# Patient Record
Sex: Female | Born: 1985 | Race: Black or African American | Hispanic: No | Marital: Single | Smoking: Never smoker
Health system: Southern US, Community
[De-identification: ages and names within clinical notes are randomized; demographics above are authoritative.]

## PROBLEM LIST (undated history)

## (undated) ENCOUNTER — Ambulatory Visit: Admission: EM | Payer: Medicaid Other

## (undated) DIAGNOSIS — G43909 Migraine, unspecified, not intractable, without status migrainosus: Secondary | ICD-10-CM

## (undated) DIAGNOSIS — R011 Cardiac murmur, unspecified: Secondary | ICD-10-CM

## (undated) DIAGNOSIS — D571 Sickle-cell disease without crisis: Secondary | ICD-10-CM

## (undated) DIAGNOSIS — D369 Benign neoplasm, unspecified site: Secondary | ICD-10-CM

## (undated) DIAGNOSIS — Z5189 Encounter for other specified aftercare: Secondary | ICD-10-CM

## (undated) DIAGNOSIS — J189 Pneumonia, unspecified organism: Secondary | ICD-10-CM

## (undated) DIAGNOSIS — L309 Dermatitis, unspecified: Secondary | ICD-10-CM

## (undated) DIAGNOSIS — I639 Cerebral infarction, unspecified: Secondary | ICD-10-CM

## (undated) DIAGNOSIS — J45909 Unspecified asthma, uncomplicated: Secondary | ICD-10-CM

## (undated) DIAGNOSIS — K602 Anal fissure, unspecified: Secondary | ICD-10-CM

## (undated) HISTORY — PX: CHOLECYSTECTOMY: SHX55

## (undated) HISTORY — DX: Dermatitis, unspecified: L30.9

## (undated) HISTORY — DX: Anal fissure, unspecified: K60.2

## (undated) HISTORY — PX: TONSILLECTOMY: SUR1361

## (undated) HISTORY — PX: DERMOID CYST  EXCISION: SHX1452

## (undated) HISTORY — DX: Migraine, unspecified, not intractable, without status migrainosus: G43.909

## (undated) HISTORY — DX: Pneumonia, unspecified organism: J18.9

## (undated) HISTORY — DX: Benign neoplasm, unspecified site: D36.9

## (undated) HISTORY — DX: Cardiac murmur, unspecified: R01.1

---
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
## 2013-05-27 DIAGNOSIS — H5210 Myopia, unspecified eye: Secondary | ICD-10-CM | POA: Insufficient documentation

## 2013-05-27 DIAGNOSIS — H5340 Unspecified visual field defects: Secondary | ICD-10-CM | POA: Insufficient documentation

## 2013-12-08 HISTORY — PX: OTHER SURGICAL HISTORY: SHX169

## 2015-03-27 DIAGNOSIS — D279 Benign neoplasm of unspecified ovary: Secondary | ICD-10-CM | POA: Insufficient documentation

## 2015-10-16 DIAGNOSIS — D649 Anemia, unspecified: Secondary | ICD-10-CM | POA: Insufficient documentation

## 2016-03-07 DIAGNOSIS — L299 Pruritus, unspecified: Secondary | ICD-10-CM | POA: Insufficient documentation

## 2016-03-24 DIAGNOSIS — I63131 Cerebral infarction due to embolism of right carotid artery: Secondary | ICD-10-CM | POA: Insufficient documentation

## 2016-03-24 DIAGNOSIS — H04129 Dry eye syndrome of unspecified lacrimal gland: Secondary | ICD-10-CM | POA: Insufficient documentation

## 2018-09-24 DIAGNOSIS — J189 Pneumonia, unspecified organism: Secondary | ICD-10-CM | POA: Insufficient documentation

## 2019-04-28 ENCOUNTER — Emergency Department (HOSPITAL_COMMUNITY): Payer: Medicaid Other

## 2019-04-28 ENCOUNTER — Other Ambulatory Visit: Payer: Self-pay

## 2019-04-28 ENCOUNTER — Emergency Department (HOSPITAL_COMMUNITY)
Admission: EM | Admit: 2019-04-28 | Discharge: 2019-04-28 | Disposition: A | Discharge status: Home / Self Care | Payer: Medicaid Other | Attending: Emergency Medicine | Admitting: Emergency Medicine

## 2019-04-28 ENCOUNTER — Encounter (HOSPITAL_COMMUNITY): Payer: Self-pay

## 2019-04-28 DIAGNOSIS — D649 Anemia, unspecified: Secondary | ICD-10-CM

## 2019-04-28 DIAGNOSIS — Z8673 Personal history of transient ischemic attack (TIA), and cerebral infarction without residual deficits: Secondary | ICD-10-CM | POA: Diagnosis not present

## 2019-04-28 DIAGNOSIS — R531 Weakness: Secondary | ICD-10-CM | POA: Insufficient documentation

## 2019-04-28 DIAGNOSIS — R195 Other fecal abnormalities: Secondary | ICD-10-CM | POA: Diagnosis not present

## 2019-04-28 DIAGNOSIS — J45909 Unspecified asthma, uncomplicated: Secondary | ICD-10-CM | POA: Diagnosis not present

## 2019-04-28 HISTORY — DX: Cerebral infarction, unspecified: I63.9

## 2019-04-28 HISTORY — DX: Unspecified asthma, uncomplicated: J45.909

## 2019-04-28 HISTORY — DX: Encounter for other specified aftercare: Z51.89

## 2019-04-28 HISTORY — DX: Sickle-cell disease without crisis: D57.1

## 2019-04-28 LAB — CBC WITH DIFFERENTIAL/PLATELET
Abs Immature Granulocytes: 0 10*3/uL (ref 0.00–0.07)
Band Neutrophils: 1 %
Basophils Absolute: 0.2 10*3/uL — ABNORMAL HIGH (ref 0.0–0.1)
Basophils Relative: 1 %
Eosinophils Absolute: 2.4 10*3/uL — ABNORMAL HIGH (ref 0.0–0.5)
Eosinophils Relative: 14 %
HCT: 18.9 % — ABNORMAL LOW (ref 36.0–46.0)
Hemoglobin: 6.5 g/dL — CL (ref 12.0–15.0)
Lymphocytes Relative: 12 %
Lymphs Abs: 2 10*3/uL (ref 0.7–4.0)
MCH: 33.3 pg (ref 26.0–34.0)
MCHC: 34.4 g/dL (ref 30.0–36.0)
MCV: 96.9 fL (ref 80.0–100.0)
Monocytes Absolute: 0.7 10*3/uL (ref 0.1–1.0)
Monocytes Relative: 4 %
Neutro Abs: 11.6 10*3/uL — ABNORMAL HIGH (ref 1.7–7.7)
Neutrophils Relative %: 68 %
Platelets: 445 10*3/uL — ABNORMAL HIGH (ref 150–400)
RBC: 1.95 MIL/uL — ABNORMAL LOW (ref 3.87–5.11)
RDW: 23.9 % — ABNORMAL HIGH (ref 11.5–15.5)
WBC: 16.8 10*3/uL — ABNORMAL HIGH (ref 4.0–10.5)
nRBC: 2.8 % — ABNORMAL HIGH (ref 0.0–0.2)
nRBC: 3 /100 WBC — ABNORMAL HIGH

## 2019-04-28 LAB — URINALYSIS, ROUTINE W REFLEX MICROSCOPIC
Bilirubin Urine: NEGATIVE
Glucose, UA: NEGATIVE mg/dL
Hgb urine dipstick: NEGATIVE
Ketones, ur: NEGATIVE mg/dL
Leukocytes,Ua: NEGATIVE
Nitrite: NEGATIVE
Protein, ur: NEGATIVE mg/dL
Specific Gravity, Urine: 1.012 (ref 1.005–1.030)
pH: 6 (ref 5.0–8.0)

## 2019-04-28 LAB — COMPREHENSIVE METABOLIC PANEL
ALT: 16 U/L (ref 0–44)
AST: 31 U/L (ref 15–41)
Albumin: 3.5 g/dL (ref 3.5–5.0)
Alkaline Phosphatase: 47 U/L (ref 38–126)
Anion gap: 10 (ref 5–15)
BUN: 6 mg/dL (ref 6–20)
CO2: 17 mmol/L — ABNORMAL LOW (ref 22–32)
Calcium: 7.9 mg/dL — ABNORMAL LOW (ref 8.9–10.3)
Chloride: 115 mmol/L — ABNORMAL HIGH (ref 98–111)
Creatinine, Ser: 0.6 mg/dL (ref 0.44–1.00)
GFR calc Af Amer: >60 mL/min (ref >60)
GFR calc non Af Amer: >60 mL/min (ref >60)
Glucose, Bld: 83 mg/dL (ref 70–99)
Potassium: 3.5 mmol/L (ref 3.5–5.1)
Sodium: 142 mmol/L (ref 135–145)
Total Bilirubin: 2.7 mg/dL — ABNORMAL HIGH (ref 0.3–1.2)
Total Protein: 5.7 g/dL — ABNORMAL LOW (ref 6.5–8.1)

## 2019-04-28 LAB — TROPONIN I: Troponin I: <0.03 ng/mL (ref <0.03)

## 2019-04-28 LAB — POC OCCULT BLOOD, ED: Fecal Occult Bld: POSITIVE — AB

## 2019-04-28 LAB — CBG MONITORING, ED: Glucose-Capillary: 97 mg/dL (ref 70–99)

## 2019-04-28 IMAGING — DX PORTABLE CHEST - 1 VIEW
1 series · 1 of 1 positions shown · non-contrast
Comparison: None.

CLINICAL DATA: Shortness of breath

EXAM:
PORTABLE CHEST 1 VIEW

[chest ap]
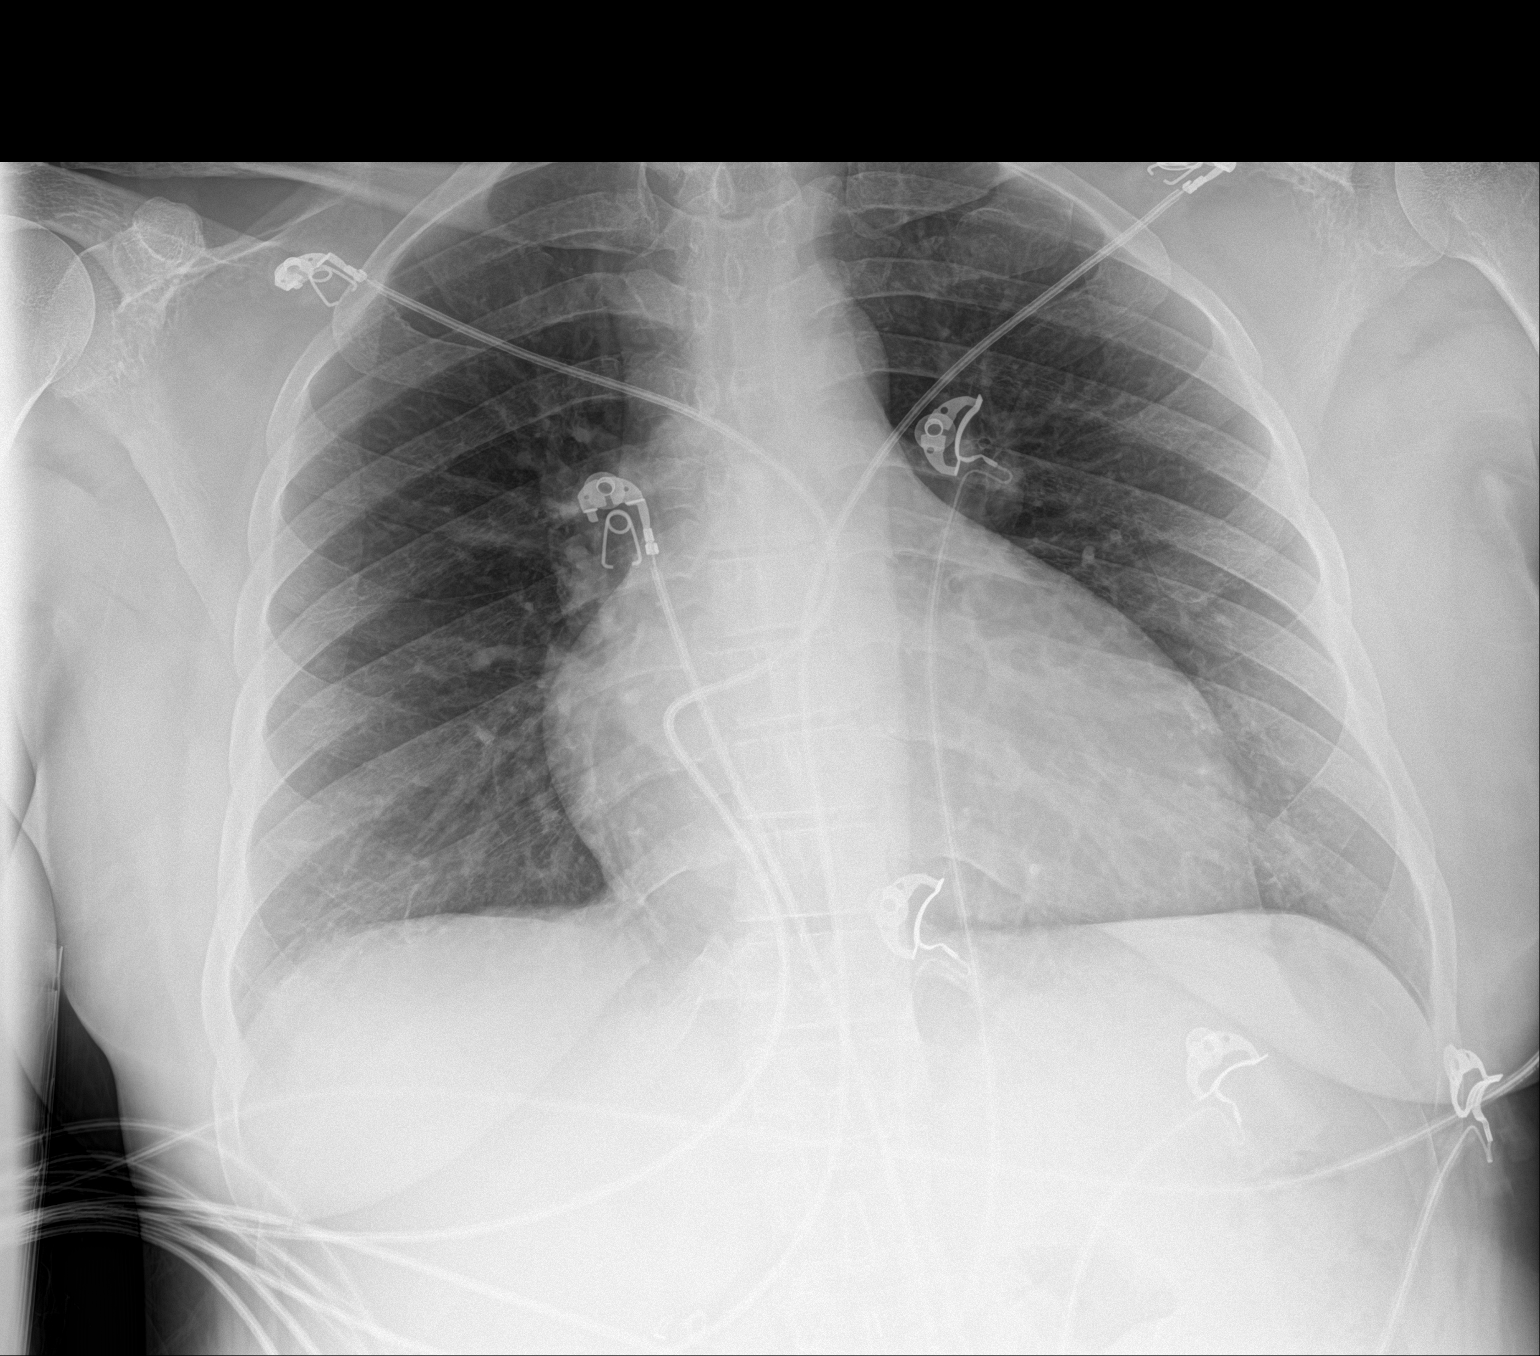

[1 of 1 positions shown; findings below may reference images not displayed]

FINDINGS: The lungs are clear. Heart is slightly enlarged with pulmonary
vascularity normal. No adenopathy. No bone lesions.
IMPRESSION: Heart slightly enlarged.  No edema or consolidation.

## 2019-04-28 MED ORDER — ACETAMINOPHEN 325 MG PO TABS
650.0000 mg | ORAL_TABLET | Freq: Once | ORAL | Status: AC
  Administered 2019-04-28: 650 mg via ORAL
  Filled 2019-04-28: qty 2

## 2019-04-28 MED ORDER — METOCLOPRAMIDE HCL 5 MG/ML IJ SOLN
10.0000 mg | Freq: Once | INTRAMUSCULAR | Status: AC
  Administered 2019-04-28: 18:00:00 10 mg via INTRAVENOUS
  Filled 2019-04-28: qty 2

## 2019-04-28 NOTE — ED Triage Notes (Signed)
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 Pt had a near syncopal episode today. Reports soft stool. Placed on 2lpm for 92% Sp02.

## 2019-04-28 NOTE — ED Notes (Signed)
Pt has been in contact with her sister and mom during the afternoon.

## 2019-04-28 NOTE — ED Notes (Signed)
Notified edp of hgb of 6.5

## 2019-04-28 NOTE — Discharge Instructions (Addendum)
You have been seen today for generalized weakness. Please read and follow all provided instructions.  ° °1. Medications: usual home medications °2. Treatment: rest, drink plenty of fluids °3. Follow Up: Please follow up with your primary doctor in 2 days for discussion of your diagnoses and further evaluation after today's visit; if you do not have a primary care doctor use the resource guide provided to find one; Please return to the ER for any new or worsening symptoms. Please obtain all of your results from medical records or have your doctors office obtain the results - share them with your doctor - you should be seen at your doctors office. Call today to arrange your follow up.  ° °Take medications as prescribed. Please review all of the medicines and only take them if you do not have an allergy to them. Return to the emergency room for worsening condition or new concerning symptoms. Follow up with your regular doctor. If you don't have a regular doctor use one of the numbers below to establish a primary care doctor. ° °Please be aware that if you are taking birth control pills, taking other prescriptions, ESPECIALLY ANTIBIOTICS may make the birth control ineffective - if this is the case, either do not engage in sexual activity or use alternative methods of birth control such as condoms until you have finished the medicine and your family doctor says it is OK to restart them. If you are on a blood thinner such as COUMADIN, be aware that any other medicine that you take may cause the coumadin to either work too much, or not enough - you should have your coumadin level rechecked in next 7 days if this is the case.  °?  °It is also a possibility that you have an allergic reaction to any of the medicines that you have been prescribed - Everybody reacts differently to medications and while MOST people have no trouble with most medicines, you may have a reaction such as nausea, vomiting, rash, swelling, shortness of  breath. If this is the case, please stop taking the medicine immediately and contact your physician.  °?  °You should return to the ER if you develop severe or worsening symptoms.  ° °Emergency Department Resource Guide °1) Find a Doctor and Pay Out of Pocket °Although you won't have to find out who is covered by your insurance plan, it is a good idea to ask around and get recommendations. You will then need to call the office and see if the doctor you have chosen will accept you as a new patient and what types of options they offer for patients who are self-pay. Some doctors offer discounts or will set up payment plans for their patients who do not have insurance, but you will need to ask so you aren't surprised when you get to your appointment. ° °2) Contact Your Local Health Department °Not all health departments have doctors that can see patients for sick visits, but many do, so it is worth a call to see if yours does. If you don't know where your local health department is, you can check in your phone book. The CDC also has a tool to help you locate your state's health department, and many state websites also have listings of all of their local health departments. ° °3) Find a Walk-in Clinic °If your illness is not likely to be very severe or complicated, you may want to try a walk in clinic. These are popping up all over   the country in pharmacies, drugstores, and shopping centers. They're usually staffed by nurse practitioners or physician assistants that have been trained to treat common illnesses and complaints. They're usually fairly quick and inexpensive. However, if you have serious medical issues or chronic medical problems, these are probably not your best option. ° °No Primary Care Doctor: °Call [facility] at  832-8000 - they can help you locate a primary care doctor that  accepts your insurance, provides certain services, etc. °Physician Referral Service- 1-800-533-3463 ° °Chronic Pain  Problems: °Organization         Address  Phone   Notes  °[facility]  ([phone]) [phone] Patients need to be referred by their primary care doctor.  ° °Medication Assistance: °Organization         Address  Phone   Notes  °[place] [facility] [address] °[place], [place] [address] [phone]) [phone] --Must be a resident of [place] °-- Must have NO insurance coverage whatsoever (no Medicaid/ Medicare, etc.) °-- The pt. MUST have a primary care doctor that directs their care regularly and follows them in the community °  °[facility]  (866) 331-1348   °[employer]  (888) 892-1162   ° °Agencies that provide inexpensive medical care: °Organization         Address  Phone   Notes  °[facility]  ([phone]) [phone]   °[facility]    ([phone]) [phone]   °[facility] [address] °[place] [address] ([phone]) [phone]   °[facility] [address], °[place] ([phone]) [phone]   °[facility]    (336) [phone]   °[facility]    (336) 272-1050   °[facility] ° [address], [place] Phone:  (336) [phone], Fax:  ([phone]) [phone] Hours of Operation:  9 am - 6 pm, M-F.  Also accepts Medicaid/Medicare and self-pay.  °[facility] ° [address], [place] Phone: ([phone]) [phone], Fax: ([phone]) [phone]. Hours of Operation:  8:30 am - 5:30 pm, M-F.  Also accepts Medicaid and self-pay.  °[facility] [place] [address], [place] Phone: ([phone]) [phone]   °[facility] [address], [place], [place] (336)723-1848, Ext. 123 Mondays & Thursdays: 7-9 AM.  First 15 patients are seen on a first come, first serve basis. °  ° °Medicaid-accepting [place] Providers: ° °Organization         Address  Phone   Notes  °[facility] [address], [place] ([phone]) [phone] Also  accepts self-pay patients.  °[facility] [address], [place] ° ([phone]) [phone]   °[facility] [address], [place] ([phone]) [phone]   °[facility] [address] [place] [address], [place] ([phone]) [phone]   °[facility] [address], [place]  ° ([phone]) [phone] Only accepts [place] Access Medicaid patients after they have their name applied to their card.  ° °Self-Pay (no insurance) in [place]: ° °Organization         Address  Phone   Notes  °Sickle Cell Patients, [facility] [address], [place] ([phone]) [phone]   °[facility] [address],  ([phone]) [phone]   °[facility] ° [address], [place] ([phone]) [phone]   °[facility]/Dr. Osei-Bonsu ° [address] [place] [address], [place] or [address], [place] [phone]) [phone] Phone number for both [place] and [place] locations is the same.  °[facility] [address], [place] ([phone]) [phone]   °[facility] [address] [place] [address],  or [address] ([phone]) [phone] °(336) [phone]   °[facility] [address],  ([phone]) [phone], phone; ([phone]) [phone], fax Sees patients 1st and 3rd Saturday of every month.  Must not qualify for public or private insurance (i.e. Medicaid, Medicare, [place] [employer] Choice, Veterans' Benefits)  Household income should be no more than 200% of the poverty level The clinic cannot treat you if you are pregnant or think you are pregnant  Sexually transmitted diseases are not treated at the clinic.  °  ° °

## 2019-04-28 NOTE — ED Notes (Signed)
Patient verbalizes understanding of discharge instructions. Opportunity for questioning and answers were provided. Armband removed by staff, pt discharged from ED.  

## 2019-04-28 NOTE — ED Notes (Signed)
Pt made aware of the need for urine.

## 2019-04-28 NOTE — ED Provider Notes (Signed)
[facility] EMERGENCY DEPARTMENT Provider Note   CSN: [id number] Arrival date & time: 04/28/19  1513    History   Chief Complaint Chief Complaint  Patient presents with  . Weakness    HPI Shannon Roy is a 33 y.o. female with a PMH of Sickle Cell Anemia, Asthma, Blood Transfusions, Migraines, and CVA in 1993 presents with constant generalized weakness onset 30 minutes ago. Patient reports she was packing up water bottles when symptoms began. Patient reports she felt lightheaded and saw spots in her vision. Patient also reports a constant throbbing frontal headache. Patient states headache is similar to her migraines. Patient denies numbness or vision field deficits. Patient reports shortness of breath with presyncopal episode, but denies shortness of breath currently. Patient reports dizziness for a few days and states she takes meclizine. Patient reports standing makes symptoms worse and resting makes symptoms better. Patient reports intermittent small amount of blood in her stool due to hemorrhoids. Patient reports this is chronic. Patient reports last blood transfusion was 1 year ago. Patient states her baseline hemoglobin is between 6-7 and states she has blood transfusions when her hemoglobin is 6.2 or lower. Patient denies nausea, vomiting, or abdominal pain. Patient denies fever, cough, congestion, rhinorrhea, sick contacts, or recent travel. LMP on 04/01/2019.      HPI  Past Medical History:  Diagnosis Date  . Asthma   . Blood transfusion without reported diagnosis   . Sickle cell anemia (HCC)   . Stroke [facility])     There are no active problems to display for this patient.   Past Surgical History:  Procedure Laterality Date  . CHOLECYSTECTOMY    . TONSILLECTOMY       OB History   No obstetric history on file.      Home Medications    Prior to Admission medications   Not on File    Family History History reviewed. No pertinent family history.   Social History Social History   Tobacco Use  . Smoking status: Never Smoker  . Smokeless tobacco: Never Used  Substance Use Topics  . Alcohol use: Not Currently  . Drug use: Not Currently     Allergies   Bee venom and Pollen extract   Review of Systems Review of Systems  Constitutional: Negative for activity change, appetite change, chills, diaphoresis, fatigue, fever and unexpected weight change.  HENT: Negative for congestion, rhinorrhea and sore throat.   Eyes: Negative for visual disturbance.  Respiratory: Positive for shortness of breath. Negative for cough.   Cardiovascular: Negative for chest pain, palpitations and leg swelling.  Gastrointestinal: Positive for blood in stool. Negative for abdominal pain, diarrhea, nausea and vomiting.  Genitourinary: Negative for dysuria, flank pain and frequency.  Musculoskeletal: Negative for myalgias.  Skin: Negative for pallor.  Neurological: Positive for dizziness, weakness, light-headedness and headaches. Negative for seizures, syncope, speech difficulty and numbness.  Hematological: Does not bruise/bleed easily.  Psychiatric/Behavioral: Negative for sleep disturbance. The patient is not nervous/anxious.     Physical Exam Updated Vital Signs BP 116/60   Pulse 85   Temp 98.8 F (37.1 C) (Oral)   Resp 20   Ht 5\' 7"  (1.702 m)   Wt 83.9 kg   LMP 04/01/2019 (Exact Date)   SpO2 99%   BMI 28.98 kg/m   Physical Exam Vitals signs and nursing note reviewed. Exam conducted with a chaperone present.  Constitutional:      General: She is not in acute distress.  Appearance: She is well-developed. She is not diaphoretic.  HENT:     Head: Normocephalic and atraumatic.     Mouth/Throat:     Mouth: Mucous membranes are moist.     Pharynx: No posterior oropharyngeal erythema.  Eyes:     Extraocular Movements: Extraocular movements intact.     Conjunctiva/sclera: Conjunctivae normal.     Pupils: Pupils are equal, round, and  reactive to light.  Neck:     Musculoskeletal: Normal range of motion.  Cardiovascular:     Rate and Rhythm: Normal rate and regular rhythm.     Heart sounds: Normal heart sounds. No murmur. No friction rub. No gallop.   Pulmonary:     Effort: Pulmonary effort is normal. No respiratory distress.     Breath sounds: Normal breath sounds. No wheezing or rales.  Abdominal:     Palpations: Abdomen is soft.     Tenderness: There is no abdominal tenderness.  Genitourinary:    Rectum: Guaiac result positive. External hemorrhoid (No bleeding noted.) present. No tenderness or anal fissure. Normal anal tone.  Musculoskeletal: Normal range of motion.  Skin:    General: Skin is warm.     Findings: No erythema or rash.  Neurological:     Mental Status: She is alert.    Mental Status:  Alert, oriented, thought content appropriate, able to give a coherent history. Speech fluent without evidence of aphasia. Able to follow 2 step commands without difficulty.  Cranial Nerves:  II:  Peripheral visual fields grossly normal, pupils equal, round, reactive to light III,IV, VI: ptosis not present, extra-ocular motions intact bilaterally  V,VII: smile symmetric, facial light touch sensation equal VIII: hearing grossly normal to voice  IX,X: symmetric elevation of soft palate, uvula elevates symmetrically  XI: bilateral shoulder shrug symmetric and strong XII: midline tongue extension without fassiculations Motor:  Normal tone. 5/5 in upper and lower extremities bilaterally including strong and equal grip strength and dorsiflexion/plantar flexion Sensory: light touch normal in all extremities.  Deep Tendon Reflexes: 2+ and symmetric in the biceps and patella Cerebellar: normal finger-to-nose with bilateral upper extremities Gait: normal gait and balance.  Negative pronator drift. Negative Romberg sign. CV: distal pulses palpable throughout    ED Treatments / Results  Labs (all labs ordered are  listed, but only abnormal results are displayed) Labs Reviewed  COMPREHENSIVE METABOLIC PANEL - Abnormal; Notable for the following components:      Result Value   Chloride 115 (*)    CO2 17 (*)    Calcium 7.9 (*)    Total Protein 5.7 (*)    Total Bilirubin 2.7 (*)    All other components within normal limits  CBC WITH DIFFERENTIAL/PLATELET - Abnormal; Notable for the following components:   WBC 16.8 (*)    RBC 1.95 (*)    Hemoglobin 6.5 (*)    HCT 18.9 (*)    RDW 23.9 (*)    Platelets 445 (*)    nRBC 2.8 (*)    Neutro Abs 11.6 (*)    Eosinophils Absolute 2.4 (*)    Basophils Absolute 0.2 (*)    nRBC 3 (*)    All other components within normal limits  POC OCCULT BLOOD, ED - Abnormal; Notable for the following components:   Fecal Occult Bld POSITIVE (*)    All other components within normal limits  TROPONIN I  URINALYSIS, ROUTINE W REFLEX MICROSCOPIC  CBC WITH DIFFERENTIAL/PLATELET  CBG MONITORING, ED  I-STAT BETA HCG BLOOD, ED ([facility],  [facility], [facility] ONLY)    EKG EKG Interpretation  Date/Time:  Thursday Apr 28 2019 15:18:13 EDT Ventricular Rate:  85 PR Interval:    QRS Duration: 88 QT Interval:  382 QTC Calculation: 455 R Axis:   56 Text Interpretation:  Sinus rhythm Confirmed by Isla Pence [phone]) on 04/28/2019 3:59:57 PM   Radiology Dg Chest Port 1 View  Result Date: 04/28/2019 CLINICAL DATA:  Shortness of breath EXAM: PORTABLE CHEST 1 VIEW COMPARISON:  None. FINDINGS: The lungs are clear. Heart is slightly enlarged with pulmonary vascularity normal. No adenopathy. No bone lesions. IMPRESSION: Heart slightly enlarged.  No edema or consolidation. Electronically Signed   By: Lowella Grip III M.D.   On: 04/28/2019 16:50    Procedures Procedures (including critical care time)  Medications Ordered in ED Medications  acetaminophen (TYLENOL) tablet 650 mg (650 mg Oral Given 04/28/19 1615)  metoCLOPramide (REGLAN) injection 10 mg (10 mg Intravenous Given 04/28/19 1801)      Initial Impression / Assessment and Plan / ED Course  I have reviewed the triage vital signs and the nursing notes.  Pertinent labs & imaging results that were available during my care of the patient were reviewed by me and considered in my medical decision making (see chart for details).  Clinical Course as of Apr 27 1918  Thu Apr 28, 2019  1708 Heart slightly enlarged.  No edema or consolidation noted on CXR.  DG Chest Port 1 View [AH]  1840 Fecal occult blood test is positive. Suspect this is likely due to hemorrhoids.   Fecal Occult Blood, POC(!): POSITIVE [AH]  1904 Hemoglobin low at 6.5. No previous values available for comparison.  Hemoglobin(!!): 6.5 [AH]  1911 Patient reports symptoms have significantly improved while in the ER. Discussed results with patient. Patient states she does not want a blood transfusion at this time. Patient reports she does not want to be admitted to the hospital.   [AH]    Clinical Course User Index [AH] Arville Lime, PA-C      Patient presents with generalized weakness. Neurological exam is benign. CXR does not reveal edema or consolidation. Hemoglobin is low at 6.5. Suspect symptoms are likely due to anemia. Provided reglan and tylenol for headache with complete relief. Symptoms significantly improved while in the ER. Patient is able to ambulate without difficulty. Discussed results in detail with the patient and patient does not wish to be admitted or receive blood transfusion at this time. We discussed the nature and purpose, risks and benefits, as well as, the alternatives of treatment. Time was given to allow the opportunity to ask questions and consider their options, and after the discussion, the patient decided to refuse the offerred treatment. Prior to refusing, I determined that the patient had the capacity to make their decision and understood the consequences of that decision. The patient was notified that they may return to the  emergency department at any time for further treatment. Advised patient to follow up with PCP in 2 days. Patient will be discharged. Patient states she understands and agrees with plan.    Findings and plan of care discussed with supervising physician Dr. Gilford Raid.  Final Clinical Impressions(s) / ED Diagnoses   Final diagnoses:  Generalized weakness  Low hemoglobin    ED Discharge Orders    None       Julienne Kass 04/28/19 Oris Drone, MD 05/03/19 [phone]

## 2019-09-07 ENCOUNTER — Emergency Department (HOSPITAL_COMMUNITY)
Admission: EM | Admit: 2019-09-07 | Discharge: 2019-09-07 | Disposition: A | Discharge status: Home / Self Care | Payer: Medicaid Other | Attending: Emergency Medicine | Admitting: Emergency Medicine

## 2019-09-07 ENCOUNTER — Encounter (HOSPITAL_COMMUNITY): Payer: Self-pay | Admitting: *Deleted

## 2019-09-07 ENCOUNTER — Emergency Department (HOSPITAL_COMMUNITY): Payer: Medicaid Other

## 2019-09-07 DIAGNOSIS — H538 Other visual disturbances: Secondary | ICD-10-CM | POA: Diagnosis not present

## 2019-09-07 DIAGNOSIS — H5712 Ocular pain, left eye: Secondary | ICD-10-CM | POA: Diagnosis present

## 2019-09-07 DIAGNOSIS — R51 Headache: Secondary | ICD-10-CM | POA: Diagnosis not present

## 2019-09-07 DIAGNOSIS — D571 Sickle-cell disease without crisis: Secondary | ICD-10-CM | POA: Insufficient documentation

## 2019-09-07 DIAGNOSIS — Z8673 Personal history of transient ischemic attack (TIA), and cerebral infarction without residual deficits: Secondary | ICD-10-CM | POA: Insufficient documentation

## 2019-09-07 DIAGNOSIS — R519 Headache, unspecified: Secondary | ICD-10-CM

## 2019-09-07 LAB — RETICULOCYTES
Immature Retic Fract: 34.3 % — ABNORMAL HIGH (ref 2.3–15.9)
RBC.: 1.92 MIL/uL — ABNORMAL LOW (ref 3.87–5.11)
Retic Count, Absolute: 316.5 10*3/uL — ABNORMAL HIGH (ref 19.0–186.0)
Retic Ct Pct: 16.5 % — ABNORMAL HIGH (ref 0.4–3.1)

## 2019-09-07 LAB — COMPREHENSIVE METABOLIC PANEL
ALT: 12 U/L (ref 0–44)
AST: 22 U/L (ref 15–41)
Albumin: 4.2 g/dL (ref 3.5–5.0)
Alkaline Phosphatase: 44 U/L (ref 38–126)
Anion gap: 11 (ref 5–15)
BUN: 5 mg/dL — ABNORMAL LOW (ref 6–20)
CO2: 19 mmol/L — ABNORMAL LOW (ref 22–32)
Calcium: 9.1 mg/dL (ref 8.9–10.3)
Chloride: 110 mmol/L (ref 98–111)
Creatinine, Ser: 0.62 mg/dL (ref 0.44–1.00)
GFR calc Af Amer: >60 mL/min (ref >60)
GFR calc non Af Amer: >60 mL/min (ref >60)
Glucose, Bld: 112 mg/dL — ABNORMAL HIGH (ref 70–99)
Potassium: 4 mmol/L (ref 3.5–5.1)
Sodium: 140 mmol/L (ref 135–145)
Total Bilirubin: 2.2 mg/dL — ABNORMAL HIGH (ref 0.3–1.2)
Total Protein: 6.8 g/dL (ref 6.5–8.1)

## 2019-09-07 LAB — CBC WITH DIFFERENTIAL/PLATELET
Abs Immature Granulocytes: 0.09 10*3/uL — ABNORMAL HIGH (ref 0.00–0.07)
Basophils Absolute: 0 10*3/uL (ref 0.0–0.1)
Basophils Relative: 0 %
Eosinophils Absolute: 2.1 10*3/uL — ABNORMAL HIGH (ref 0.0–0.5)
Eosinophils Relative: 20 %
HCT: 18 % — ABNORMAL LOW (ref 36.0–46.0)
Hemoglobin: 6 g/dL — CL (ref 12.0–15.0)
Immature Granulocytes: 1 %
Lymphocytes Relative: 26 %
Lymphs Abs: 3 10*3/uL (ref 0.7–4.0)
MCH: 31.3 pg (ref 26.0–34.0)
MCHC: 33.3 g/dL (ref 30.0–36.0)
MCV: 93.8 fL (ref 80.0–100.0)
Monocytes Absolute: 1.2 10*3/uL — ABNORMAL HIGH (ref 0.1–1.0)
Monocytes Relative: 10 %
Neutro Abs: 4.8 10*3/uL (ref 1.7–7.7)
Neutrophils Relative %: 43 %
Platelets: 429 10*3/uL — ABNORMAL HIGH (ref 150–400)
RBC: 1.92 MIL/uL — ABNORMAL LOW (ref 3.87–5.11)
RDW: 23.6 % — ABNORMAL HIGH (ref 11.5–15.5)
WBC: 11.1 10*3/uL — ABNORMAL HIGH (ref 4.0–10.5)
nRBC: 2.7 % — ABNORMAL HIGH (ref 0.0–0.2)

## 2019-09-07 MED ORDER — GADOBUTROL 1 MMOL/ML IV SOLN
8.0000 mL | Freq: Once | INTRAVENOUS | Status: AC | PRN
  Administered 2019-09-07: 8 mL via INTRAVENOUS

## 2019-09-07 MED ORDER — SODIUM CHLORIDE 0.9 % IV BOLUS
1000.0000 mL | Freq: Once | INTRAVENOUS | Status: AC
  Administered 2019-09-07: 07:00:00 1000 mL via INTRAVENOUS

## 2019-09-07 MED ORDER — PROCHLORPERAZINE EDISYLATE 10 MG/2ML IJ SOLN
10.0000 mg | Freq: Once | INTRAMUSCULAR | Status: AC
  Administered 2019-09-07: 07:00:00 10 mg via INTRAVENOUS
  Filled 2019-09-07: qty 2

## 2019-09-07 MED ORDER — DEXAMETHASONE SODIUM PHOSPHATE 10 MG/ML IJ SOLN
10.0000 mg | Freq: Once | INTRAMUSCULAR | Status: AC
  Administered 2019-09-07: 10 mg via INTRAVENOUS
  Filled 2019-09-07: qty 1

## 2019-09-07 MED ORDER — KETOROLAC TROMETHAMINE 15 MG/ML IJ SOLN
15.0000 mg | Freq: Once | INTRAMUSCULAR | Status: AC
  Administered 2019-09-07: 15 mg via INTRAVENOUS
  Filled 2019-09-07: qty 1

## 2019-09-07 NOTE — ED Notes (Signed)
Patient transported to MRI 

## 2019-09-07 NOTE — ED Triage Notes (Signed)
To ED for eval after being woke with left eye pain. States she has a HA on left side as well. States when she woke she was unable to see out of her left eye - completely dark. Pt got up and went to wash her eye with water. Then could see out of eye but blurry. Reports eye was burning. Burning is now better but vision remains blurred. No neuro deficits noted. Pt ambulatory without difficulty. Speech clear.

## 2019-09-07 NOTE — ED Provider Notes (Signed)
  Physical Exam  BP 110/61   Pulse 67   Temp 98.6 F (37 C) (Oral)   Resp 16   SpO2 100%   Physical Exam  ED Course/Procedures     Procedures  MDM  Received patient in signout.  Headache visual change sickle cell disease.  Patient's headache feels much better after treatment.  Visual changes improved.  Vision almost back to normal.  MRI done and does not show neural abnormality.  Potentially could be complicated migraine although she has not had neuro deficits with her migraines in the past.  Stroke felt less likely.  Discharge home.       Davonna Belling, MD 09/07/19 1010

## 2019-09-07 NOTE — ED Provider Notes (Signed)
[facility] EMERGENCY DEPARTMENT Provider Note  CSN: [id number] Arrival date & time: 09/07/19 [id number]  Chief Complaint(s) Blurred Vision, Eye Pain, and Sickle Cell Pain Crisis  HPI Shannon Roy is a 33 y.o. female with a history of migraine headaches, sickle cell disease and prior related stroke with no residual deficits who presents to the emergency department with left eye pain and visual disturbance noted this morning.  Patient reports waking up feeling burning pain behind her eye as well as left frontal headache.  She reports associated blurry vision.  She denies any trauma.  No recent fevers or infections.  No cough or congestion.  No speech difficulty.  No other focal deficits.  She denies frequent sickle cell crises.  HPI  Past Medical History Past Medical History:  Diagnosis Date  . Asthma   . Blood transfusion without reported diagnosis   . Sickle cell anemia (HCC)   . Stroke [facility])    There are no active problems to display for this patient.  Home Medication(s) Prior to Admission medications   Not on File                                                                                                                                    Past Surgical History Past Surgical History:  Procedure Laterality Date  . CHOLECYSTECTOMY    . TONSILLECTOMY     Family History No family history on file.  Social History Social History   Tobacco Use  . Smoking status: Never Smoker  . Smokeless tobacco: Never Used  Substance Use Topics  . Alcohol use: Not Currently  . Drug use: Not Currently   Allergies Bee venom and Pollen extract  Review of Systems Review of Systems All other systems are reviewed and are negative for acute change except as noted in the HPI  Physical Exam Vital Signs  I have reviewed the triage vital signs BP 131/73   Pulse 75   Temp 98.6 F (37 C) (Oral)   Resp 16   SpO2 100%   Physical Exam Vitals signs reviewed.   Constitutional:      General: She is not in acute distress.    Appearance: She is well-developed. She is not diaphoretic.  HENT:     Head: Normocephalic and atraumatic.     Right Ear: External ear normal.     Left Ear: External ear normal.     Nose: Nose normal.  Eyes:     General: No scleral icterus.    Conjunctiva/sclera: Conjunctivae normal.     Comments: R: 20/20 L: 20/25 With corrective glasses.  Dyschromatopsia with left eye with +Pulfrich effect on left.  Neck:     Musculoskeletal: Normal range of motion.     Trachea: Phonation normal.  Cardiovascular:     Rate and Rhythm: Normal rate and regular rhythm.  Pulmonary:     Effort: Pulmonary effort is normal. No  respiratory distress.     Breath sounds: No stridor.  Abdominal:     General: There is no distension.  Musculoskeletal: Normal range of motion.  Neurological:     Mental Status: She is alert and oriented to person, place, and time.     Comments: Mental Status:  Alert and oriented to person, place, and time.  Attention and concentration normal.  Speech clear.  Recent memory is intact  Cranial Nerves:  II Visual Fields: Intact to confrontation. Visual fields intact. III, IV, VI: Pupils equal and reactive to light and near. Full eye movement without nystagmus  V Facial Sensation: Normal. No weakness of masticatory muscles  VII: No facial weakness or asymmetry  VIII Auditory Acuity: Grossly normal  IX/X: The uvula is midline; the palate elevates symmetrically  XI: Normal sternocleidomastoid and trapezius strength  XII: The tongue is midline. No atrophy or fasciculations.   Motor System: Muscle Strength: 5/5 and symmetric in the upper and lower extremities. No pronation or drift.  Muscle Tone: Tone and muscle bulk are normal in the upper and lower extremities.   Reflexes: DTRs: 1+ and symmetrical in all four extremities.  Coordination: Intact finger-to-nose. No tremor.  Sensation: Intact to light touch..   Gait: Routine  gait normal.   Psychiatric:        Behavior: Behavior normal.     ED Results and Treatments Labs (all labs ordered are listed, but only abnormal results are displayed) Labs Reviewed  CBC WITH DIFFERENTIAL/PLATELET  COMPREHENSIVE METABOLIC PANEL  RETICULOCYTES                                                                                                                         EKG  EKG Interpretation  Date/Time:    Ventricular Rate:    PR Interval:    QRS Duration:   QT Interval:    QTC Calculation:   R Axis:     Text Interpretation:        Radiology No results found.  Pertinent labs & imaging results that were available during my care of the patient were reviewed by me and considered in my medical decision making (see chart for details).  Medications Ordered in ED Medications  prochlorperazine (COMPAZINE) injection 10 mg (has no administration in time range)  dexamethasone (DECADRON) injection 10 mg (has no administration in time range)  ketorolac (TORADOL) 15 MG/ML injection 15 mg (has no administration in time range)  sodium chloride 0.9 % bolus 1,000 mL (has no administration in time range)  Procedures Procedures  (including critical care time)  Medical Decision Making / ED Course I have reviewed the nursing notes for this encounter and the patient's prior records (if available in EHR or on provided paperwork).   Shannon Roy was evaluated in Emergency Department on 09/07/2019 for the symptoms described in the history of present illness. She was evaluated in the context of the global COVID-19 pandemic, which necessitated consideration that the patient might be at risk for infection with the SARS-CoV-2 virus that causes COVID-19. Institutional protocols and algorithms that pertain to the evaluation of patients at risk for  COVID-19 are in a state of rapid change based on information released by regulatory bodies including the CDC and federal and state organizations. These policies and algorithms were followed during the patient's care in the ED.  Left eye pain with blurry vision.  Positive dyschromatopsia and Pulfrich effect on to affected eye.  No other focal deficits.  Migraine headache versus optic neuritis.  Low suspicion for CVA.  Patient denies any other physical complaints supporting sickle cell crisis.  Screening labs obtained.  Patient provided with IV fluids.  Will attempt migraine cocktail.  MRI with and without to assess for optic neuritis.  Patient care turned over to oncoming provider. Patient case and results discussed in detail; please see their note for further ED managment.        This chart was dictated using voice recognition software.  Despite best efforts to proofread,  errors can occur which can change the documentation meaning.   Fatima Blank, MD 09/07/19 [phone]

## 2019-09-07 NOTE — Discharge Instructions (Addendum)
Followup with your neurologist as needed. 

## 2019-09-20 ENCOUNTER — Other Ambulatory Visit: Payer: Self-pay

## 2019-09-20 ENCOUNTER — Encounter: Payer: Self-pay | Admitting: Family

## 2019-09-20 ENCOUNTER — Ambulatory Visit (INDEPENDENT_AMBULATORY_CARE_PROVIDER_SITE_OTHER): Payer: Medicaid Other | Admitting: Family

## 2019-09-20 ENCOUNTER — Telehealth: Payer: Self-pay | Admitting: *Deleted

## 2019-09-20 VITALS — BP 126/64 | HR 78 | Temp 97.4°F | Resp 16 | Ht 67.0 in | Wt 176.0 lb

## 2019-09-20 DIAGNOSIS — J45990 Exercise induced bronchospasm: Secondary | ICD-10-CM

## 2019-09-20 DIAGNOSIS — Z8673 Personal history of transient ischemic attack (TIA), and cerebral infarction without residual deficits: Secondary | ICD-10-CM

## 2019-09-20 DIAGNOSIS — K219 Gastro-esophageal reflux disease without esophagitis: Secondary | ICD-10-CM

## 2019-09-20 DIAGNOSIS — G43909 Migraine, unspecified, not intractable, without status migrainosus: Secondary | ICD-10-CM | POA: Diagnosis not present

## 2019-09-20 DIAGNOSIS — D571 Sickle-cell disease without crisis: Secondary | ICD-10-CM | POA: Diagnosis not present

## 2019-09-20 DIAGNOSIS — K649 Unspecified hemorrhoids: Secondary | ICD-10-CM

## 2019-09-20 LAB — CBC WITH DIFFERENTIAL/PLATELET
Basophils Absolute: 0.1 10*3/uL (ref 0.0–0.1)
Basophils Relative: 0.9 % (ref 0.0–3.0)
Eosinophils Absolute: 1.3 10*3/uL — ABNORMAL HIGH (ref 0.0–0.7)
Eosinophils Relative: 11.7 % — ABNORMAL HIGH (ref 0.0–5.0)
HCT: 20.1 % — CL (ref 36.0–46.0)
Hemoglobin: 6.7 g/dL — CL (ref 12.0–15.0)
Lymphocytes Relative: 28 % (ref 12.0–46.0)
Lymphs Abs: 3 10*3/uL (ref 0.7–4.0)
MCHC: 33.6 g/dL (ref 30.0–36.0)
MCV: 102.7 fl — ABNORMAL HIGH (ref 78.0–100.0)
Monocytes Absolute: 1 10*3/uL (ref 0.1–1.0)
Monocytes Relative: 9.4 % (ref 3.0–12.0)
Neutro Abs: 5.4 10*3/uL (ref 1.4–7.7)
Neutrophils Relative %: 50 % (ref 43.0–77.0)
Platelets: 400 10*3/uL (ref 150.0–400.0)
RBC: 1.96 Mil/uL — ABNORMAL LOW (ref 3.87–5.11)
RDW: 25.2 % — ABNORMAL HIGH (ref 11.5–15.5)
WBC: 10.9 10*3/uL — ABNORMAL HIGH (ref 4.0–10.5)

## 2019-09-20 MED ORDER — SUMATRIPTAN SUCCINATE 50 MG PO TABS: 50.0000 mg | ORAL_TABLET | ORAL | 5 refills | Status: DC | PRN

## 2019-09-20 NOTE — Progress Notes (Signed)
Subjective:    Patient ID: Shannon Roy, female    DOB: 10-19-1986, 33 y.o.   MRN: [id number]  HPI  Patient is a 33 yr old female who presents today to establish care.  Pmhx is significant for the following:  Stroke- reports that back in 6th grade she began having "black out spells." Reports that she was told that these are "mini strokes."  Reports that this happened for 1 year.   Sickle Cell Anemia- she was recently seen in the ED on 9/30. Hemoglobin was 6.0 at that time. Her hemoglobin ranges 5.9-6.7. diagnosed when she was 9, used to get 3 pints blood every 2 weeks.  At age 86 she has been doing as needed. She gets transfused about once a year. Denies SOB.    Migraines- patient had an MRI of the brain performed on 09/07/19 during a recent visit to the ED for loss of vision in the left eye.  Reports that she keeps a constant HA (always at a 2), Then sometimes will increase to 6.  Reports typical migraine has photophobia/phonophobia.  Reports that she typically starts with 2 aleve, BC's sometimes.  Rare use oxycodone.   Asthma- reports that this is exercise induced. Worse in the spring and fall. Good response to premedication with albuterol.   GERD-  Reports "horrible" reflux.  Reports that she also uses mylanta prn.  Reports gerd symptoms about 2 days a week.    Hemorrhoids- tries to add more fiber to her diet. Has also tried miralax and benefiber.  Reports bleeding external hemorrhoid which are painful.    Notes some bilateral LE edema for the last 2 days. Denies calf pain or swelling.    Review of Systems  Constitutional: Negative for unexpected weight change.  HENT: Negative for hearing loss and rhinorrhea.   Eyes: Negative for visual disturbance.  Respiratory: Negative for cough and shortness of breath.   Cardiovascular: Negative for chest pain.  Gastrointestinal: Positive for constipation. Negative for diarrhea.  Genitourinary: Negative for dysuria and frequency.    Musculoskeletal: Negative for arthralgias and myalgias.  Skin: Positive for rash (eczema).  Neurological: Positive for headaches.  Hematological: Negative for adenopathy.  Psychiatric/Behavioral:       Denies depression/anxiety    Past Medical History:  Diagnosis Date   Asthma    Blood transfusion without reported diagnosis    Dermoid cyst    iguinal area   Eczema    Migraines    Murmur, cardiac    Sickle cell anemia (HCC)    Stroke [facility])      Social History   Socioeconomic History   Marital status: Single    Spouse name: Not on file   Number of children: Not on file   Years of education: Not on file   Highest education level: Not on file  Occupational History   Occupation: sousing annalist  Social Needs   Financial resource strain: Not hard at all   Food insecurity    Worry: Never true    Inability: Never true   Transportation needs    Medical: No    Non-medical: No  Tobacco Use   Smoking status: Never Smoker   Smokeless tobacco: Never Used  Substance and Sexual Activity   Alcohol use: Not Currently   Drug use: Not Currently   Sexual activity: Not Currently  Lifestyle   Physical activity    Days per week: 3 days    Minutes per session: 60 min   Stress: Not  on file  Relationships   Social connections    Talks on phone: More than three times a week    Gets together: Once a week    Attends religious service: Never    Active member of club or organization: Yes    Attends meetings of clubs or organizations: 1 to 4 times per year    Relationship status: Not on file   Intimate partner violence    Fear of current or ex partner: Not on file    Emotionally abused: Not on file    Physically abused: Not on file    Forced sexual activity: Not on file  Other Topics Concern   Not on file  Social History Narrative   No children   [place]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   Lives with her youngest sister   Cat and a dog   Enjoys hiking,  photography, restaurants    Past Surgical History:  Procedure Laterality Date   ANKLE SURGERY Right 2002   broken after a fall   anklesurgery Right 2015   to have hardware removed   CHOLECYSTECTOMY     DERMOID CYST  EXCISION Bilateral    2018, removed from inguinal area   TONSILLECTOMY      Family History  Problem Relation Age of Onset   Diabetes Mother    Sickle cell trait Sister    Sickle cell trait Sister    Depression Sister    Breast cancer Maternal Aunt    Diabetes Maternal Aunt    Breast cancer Maternal Aunt    Diabetes Maternal Aunt     Allergies  Allergen Reactions   Bee Venom Swelling   Pollen Extract Other (See Comments)    Seasonal allergies     Current Outpatient Medications on File Prior to Visit  Medication Sig Dispense Refill   albuterol (VENTOLIN HFA) 108 (90 Base) MCG/ACT inhaler Inhale 1-2 puffs into the lungs every 6 (six) hours as needed for wheezing or shortness of breath.     clobetasol cream (TEMOVATE) [id number] % Apply 1 application topically daily.     EPINEPHrine 0.3 mg/0.3 mL IJ SOAJ injection Inject 0.3 mg into the muscle daily as needed for anaphylaxis.     folic acid (FOLVITE) 1 MG tablet Take 1 mg by mouth daily.     hydroxypropyl methylcellulose / hypromellose (ISOPTO TEARS / GONIOVISC) 2.5 % ophthalmic solution Place 1 drop into both eyes 3 (three) times daily as needed for dry eyes.     oxyCODONE-acetaminophen (PERCOCET/ROXICET) 5-325 MG tablet Take 1-2 tablets by mouth every 4 (four) hours as needed for severe pain.     pantoprazole (PROTONIX) 40 MG tablet Take 40 mg by mouth daily.     triamcinolone cream (KENALOG) 0.1 % Apply 1 application topically 2 (two) times daily as needed (skin care).     triamcinolone cream (KENALOG) 0.5 % Apply 1 application topically daily.     No current facility-administered medications on file prior to visit.     BP 126/64 (BP Location: Right Arm, Patient Position: Sitting, Cuff  Size: Small)    Pulse 78    Temp (!) 97.4 F (36.3 C) (Temporal)    Resp 16    Ht 5\' 7"  (1.702 m)    Wt 176 lb (79.8 kg)    SpO2 100%    BMI 27.57 kg/m        Objective:   Physical Exam Constitutional:      Appearance: She is well-developed.  HENT:  Right Ear: Tympanic membrane and ear canal normal.     Left Ear: Tympanic membrane and ear canal normal.  Neck:     Musculoskeletal: Neck supple.     Thyroid: No thyromegaly.  Cardiovascular:     Rate and Rhythm: Normal rate and regular rhythm.     Heart sounds: Normal heart sounds. No murmur.  Pulmonary:     Effort: Pulmonary effort is normal. No respiratory distress.     Breath sounds: Normal breath sounds. No wheezing.  Abdominal:     Palpations: Abdomen is soft.  Musculoskeletal:     Comments: Trace bilateral ankle swelling  Lymphadenopathy:     Cervical: No cervical adenopathy.  Skin:    General: Skin is warm and dry.  Neurological:     Mental Status: She is alert and oriented to person, place, and time.  Psychiatric:        Behavior: Behavior normal.        Thought Content: Thought content normal.        Judgment: Judgment normal.           Assessment & Plan:  Sickle Cell anemia- baseline hgb around 6.4 per patient. States that her hematologist would like hgb > 6.  Pt asymptomatic. Check CBC today.  She plans to continue with her hematologist in [place].   Migraines- trial of prn imitrex.  Has chronic daily headaches as well.  Refer to neurology.  Hx of CVA- refer to neurology for recommendations on secondary stroke prevention.  These strokes occurred as a child. I am not sure if she should be on ASA/Statin therapy- will defer to neurology.  Hemorrhoids- encouraged high fiber, high water diet. She is interested in banding of her hemorrhoids. Refer to GI.    GERD- uncontrolled despite protonix.  Discussed gerd diet.  Refer to GI.  Exercise induced asthma- stable with albuterol prior to exercise.

## 2019-09-20 NOTE — Telephone Encounter (Signed)
CRITICAL VALUE STICKER  CRITICAL VALUE: hgb 6.7  hct  20.1.  Lab is adding smear and manual diff count.  RECEIVER (on-site recipient of call):  Kelle Darting, [place] NOTIFIED:  09/20/19 @ 2:54pm  MESSENGER (representative from lab): Shari Prows.  MD NOTIFIED:  Lenna Sciara / Rod Holler  TIME OF NOTIFICATION:  2:57pm  RESPONSE:

## 2019-09-20 NOTE — Patient Instructions (Signed)
Food Choices for Gastroesophageal Reflux Disease, Adult When you have gastroesophageal reflux disease (GERD), the foods you eat and your eating habits are very important. Choosing the right foods can help ease the discomfort of GERD. Consider working with a diet and nutrition specialist (dietitian) to help you make healthy food choices. What general guidelines should I follow?  Eating plan  Choose healthy foods low in fat, such as fruits, vegetables, whole grains, low-fat dairy products, and lean meat, fish, and poultry.  Eat frequent, small meals instead of three large meals each day. Eat your meals slowly, in a relaxed setting. Avoid bending over or lying down until 2-3 hours after eating.  Limit high-fat foods such as fatty meats or fried foods.  Limit your intake of oils, butter, and shortening to less than 8 teaspoons each day.  Avoid the following: ? Foods that cause symptoms. These may be different for different people. Keep a food diary to keep track of foods that cause symptoms. ? Alcohol. ? Drinking large amounts of liquid with meals. ? Eating meals during the 2-3 hours before bed.  Cook foods using methods other than frying. This may include baking, grilling, or broiling. Lifestyle  Maintain a healthy weight. Ask your health care provider what weight is healthy for you. If you need to lose weight, work with your health care provider to do so safely.  Exercise for at least 30 minutes on 5 or more days each week, or as told by your health care provider.  Avoid wearing clothes that fit tightly around your waist and chest.  Do not use any products that contain nicotine or tobacco, such as cigarettes and e-cigarettes. If you need help quitting, ask your health care provider.  Sleep with the head of your bed raised. Use a wedge under the mattress or blocks under the bed frame to raise the head of the bed. What foods are not recommended? The items listed may not be a complete  list. Talk with your dietitian about what dietary choices are best for you. Grains Pastries or quick breads with added fat. French toast. Vegetables Deep fried vegetables. French fries. Any vegetables prepared with added fat. Any vegetables that cause symptoms. For some people this may include tomatoes and tomato products, chili peppers, onions and garlic, and horseradish. Fruits Any fruits prepared with added fat. Any fruits that cause symptoms. For some people this may include citrus fruits, such as oranges, grapefruit, pineapple, and lemons. Meats and other protein foods High-fat meats, such as fatty beef or pork, hot dogs, ribs, ham, sausage, salami and bacon. Fried meat or protein, including fried fish and fried chicken. Nuts and nut butters. Dairy Whole milk and chocolate milk. Sour cream. Cream. Ice cream. Cream cheese. Milk shakes. Beverages Coffee and tea, with or without caffeine. Carbonated beverages. Sodas. Energy drinks. Fruit juice made with acidic fruits (such as orange or grapefruit). Tomato juice. Alcoholic drinks. Fats and oils Butter. Margarine. Shortening. Ghee. Sweets and desserts Chocolate and cocoa. Donuts. Seasoning and other foods Pepper. Peppermint and spearmint. Any condiments, herbs, or seasonings that cause symptoms. For some people, this may include curry, hot sauce, or vinegar-based salad dressings. Summary  When you have gastroesophageal reflux disease (GERD), food and lifestyle choices are very important to help ease the discomfort of GERD.  Eat frequent, small meals instead of three large meals each day. Eat your meals slowly, in a relaxed setting. Avoid bending over or lying down until 2-3 hours after eating.  Limit high-fat   foods such as fatty meat or fried foods. This information is not intended to replace advice given to you by your health care provider. Make sure you discuss any questions you have with your health care provider. Document Released:  11/24/2005 Document Revised: 03/17/2019 Document Reviewed: 11/25/2016 Elsevier Patient Education  2020 [employer]  

## 2019-09-21 ENCOUNTER — Encounter: Payer: Self-pay | Admitting: Gastroenterology

## 2019-09-22 ENCOUNTER — Encounter: Payer: Self-pay | Admitting: Neurology

## 2019-10-12 ENCOUNTER — Encounter: Payer: Medicaid Other | Admitting: Family

## 2019-10-19 NOTE — Progress Notes (Signed)
NEUROLOGY CONSULTATION NOTE  Shannon Roy MRN: [id number] DOB: 1986-01-22  Referring provider: Debbrah Alar, NP Primary care provider: Debbrah Alar, NP  Reason for consult:  Migraines, history of CVA  HISTORY OF PRESENT ILLNESS: Shannon Roy is a 33 year old left-handed female with sickle cell anemia with history of stroke, asthma and migraines who presents for migraines and history of CVA.  History supplemented by referring provider note.    She started having a persistent headache since age 29.  She has had multiple MRIs in the past.  Headaches vary (may be unilateral either side, frontal or back of neck.  It is pounding.  They are typically at 2-3/10 but they may become 8-10/10 usually lasting 1 to 4 hours and occur once a month.  Typical associated symptoms include burning on the head, photophobia, phonophobia, nausea, osmophobia.  No associated vomiting, visual disturbance or unilateral numbness or weakness.  No identifiable trigger.  Vomiting and resting in dark room helps relieve.  She went to the ED on 09/07/2019 because she had a severe left sided migraine associated with complete vision loss of left eye for 20-30 minutes followed by blurred vision in the left eye and tingling around the eye.  Blurred vision lasted 8 hours.  MRI of brain with and without contrast from 09/07/2019 was personally reviewed and was normal.  She sometimes has positional vertigo.  Other history:  She reports that in 6th grade, she began having syncopal spells in which she vision blacked out and then lost consciousness.  At the time, she was told they were "mini strokes".  Episodes resolved after about 1 year.  Rescue therapy:  Aleve and BC for mild to moderate headache; percocet for severe headache. Frequency of analgesics:  Aleve and BC daily.  Current NSAIDS:  Aleve Current analgesics:  Percocet; BC powder Current triptans:  none Current ergotamine:  none Current anti-emetic:  none Current  muscle relaxants:  none Current anti-anxiolytic:  none Current sleep aide:  none Current Antihypertensive medications:  none Current Antidepressant medications:  none Current Anticonvulsant medications:  none Current anti-CGRP:  none Current Vitamins/Herbal/Supplements:  none Current Antihistamines/Decongestants:  none Other therapy:  none Hormone/birth control:  none  Past NSAIDS:  Ibuprofen, naproxen, flurbiprofen Past analgesics:  Fioricet; oxycodone Past abortive triptans:  Maxalt 10mg ; sumatriptan 50mg  Past abortive ergotamine:  none Past muscle relaxants:  Robaxin Past anti-emetic:  Reglan Past antihypertensive medications:  none Past antidepressant medications:  Amitriptyline (ineffective, drowsiness) Past anticonvulsant medications:  Topiramate (ineffective) Past anti-CGRP:  none Past vitamins/Herbal/Supplements:  none Past antihistamines/decongestants:  none Other past therapies:  none  Caffeine:  1 cup of coffee 10 days a month; 1 cup of tea 10 days a month; drinks a Coke 2 days a month Diet:  6 bottles of water daily Exercise:  Not routine Depression:  Moderate depression; Anxiety:  yes Other pain:  Sickle cell crisis infrequently Sleep hygiene:  Poor.  Difficulty falling asleep. Family history of headache:  Mom (migraines), 2 sisters (migraines), 3 maternal aunts (migraines)   PAST MEDICAL HISTORY: Past Medical History:  Diagnosis Date  . Asthma   . Blood transfusion without reported diagnosis   . Dermoid cyst    iguinal area  . Eczema   . Migraines   . Murmur, cardiac   . Sickle cell anemia (HCC)   . Stroke [facility])     PAST SURGICAL HISTORY: Past Surgical History:  Procedure Laterality Date  . ANKLE SURGERY Right 2002   broken after a  fall  . anklesurgery Right 2015   to have hardware removed  . CHOLECYSTECTOMY    . DERMOID CYST  EXCISION Bilateral    2018, removed from inguinal area  . TONSILLECTOMY      MEDICATIONS: Current Outpatient  Medications on File Prior to Visit  Medication Sig Dispense Refill  . albuterol (VENTOLIN HFA) 108 (90 Base) MCG/ACT inhaler Inhale 1-2 puffs into the lungs every 6 (six) hours as needed for wheezing or shortness of breath.    . clobetasol cream (TEMOVATE) AB-123456789 % Apply 1 application topically daily.    Marland Kitchen EPINEPHrine 0.3 mg/0.3 mL IJ SOAJ injection Inject 0.3 mg into the muscle daily as needed for anaphylaxis.    . folic acid (FOLVITE) 1 MG tablet Take 1 mg by mouth daily.    . hydroxypropyl methylcellulose / hypromellose (ISOPTO TEARS / GONIOVISC) 2.5 % ophthalmic solution Place 1 drop into both eyes 3 (three) times daily as needed for dry eyes.    Marland Kitchen oxyCODONE-acetaminophen (PERCOCET/ROXICET) 5-325 MG tablet Take 1-2 tablets by mouth every 4 (four) hours as needed for severe pain.    . pantoprazole (PROTONIX) 40 MG tablet Take 40 mg by mouth daily.    . SUMAtriptan (IMITREX) 50 MG tablet Take 1 tablet (50 mg total) by mouth every 2 (two) hours as needed for migraine. May repeat in 2 hours if headache persists or recurs. 10 tablet 5  . triamcinolone cream (KENALOG) 0.1 % Apply 1 application topically 2 (two) times daily as needed (skin care).    . triamcinolone cream (KENALOG) 0.5 % Apply 1 application topically daily.     No current facility-administered medications on file prior to visit.     ALLERGIES: Allergies  Allergen Reactions  . Bee Venom Swelling  . Pollen Extract Other (See Comments)    Seasonal allergies     FAMILY HISTORY: Family History  Problem Relation Age of Onset  . Diabetes Mother   . Sickle cell trait Sister   . Sickle cell trait Sister   . Depression Sister   . Breast cancer Maternal Aunt   . Diabetes Maternal Aunt   . Breast cancer Maternal Aunt   . Diabetes Maternal Aunt    SOCIAL HISTORY: Social History   Socioeconomic History  . Marital status: Single    Spouse name: Not on file  . Number of children: Not on file  . Years of education: Not on file   . Highest education level: Not on file  Occupational History  . Occupation: sousing annalist  Social Needs  . Financial resource strain: Not hard at all  . Food insecurity    Worry: Never true    Inability: Never true  . Transportation needs    Medical: No    Non-medical: No  Tobacco Use  . Smoking status: Never Smoker  . Smokeless tobacco: Never Used  Substance and Sexual Activity  . Alcohol use: Not Currently  . Drug use: Not Currently  . Sexual activity: Not Currently  Lifestyle  . Physical activity    Days per week: 3 days    Minutes per session: 60 min  . Stress: Not on file  Relationships  . Social connections    Talks on phone: More than three times a week    Gets together: Once a week    Attends religious service: Never    Active member of club or organization: Yes    Attends meetings of clubs or organizations: 1 to 4 times per year  Relationship status: Not on file  . Intimate partner violence    Fear of current or ex partner: Not on file    Emotionally abused: Not on file    Physically abused: Not on file    Forced sexual activity: Not on file  Other Topics Concern  . Not on file  Social History Narrative   No children   [employer]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   Lives with her youngest sister   Cat and a dog   Enjoys hiking, photography, restaurants    REVIEW OF SYSTEMS: Constitutional: No fevers, chills, or sweats, no generalized fatigue, change in appetite Eyes: No visual changes, double vision, eye pain Ear, nose and throat: No hearing loss, ear pain, nasal congestion, sore throat Cardiovascular: No chest pain, palpitations Respiratory:  No shortness of breath at rest or with exertion, wheezes GastrointestinaI: No nausea, vomiting, diarrhea, abdominal pain, fecal incontinence Genitourinary:  No dysuria, urinary retention or frequency Musculoskeletal:  No neck pain, back pain Integumentary: No rash, pruritus, skin lesions Neurological: as above  Psychiatric: No depression, insomnia, anxiety Endocrine: No palpitations, fatigue, diaphoresis, mood swings, change in appetite, change in weight, increased thirst Hematologic/Lymphatic:  No purpura, petechiae. Allergic/Immunologic: no itchy/runny eyes, nasal congestion, recent allergic reactions, rashes  PHYSICAL EXAM: Blood pressure 126/80, pulse 92, height 5\' 7"  (1.702 m), weight 176 lb (79.8 kg), SpO2 98 %. General: No acute distress.  Patient appears well-groomed.   Head:  Normocephalic/atraumatic Eyes:  fundi examined but not visualized Neck: supple, no paraspinal tenderness, full range of motion Back: No paraspinal tenderness Heart: regular rate and rhythm Lungs: Clear to auscultation bilaterally. Vascular: No carotid bruits. Neurological Exam: Mental status: alert and oriented to person, place, and time, recent and remote memory intact, fund of knowledge intact, attention and concentration intact, speech fluent and not dysarthric, language intact. Cranial nerves: CN I: not tested CN II: pupils equal, round and reactive to light, visual fields intact CN III, IV, VI:  full range of motion, no nystagmus, no ptosis CN V: facial sensation intact CN VII: upper and lower face symmetric CN VIII: hearing intact CN IX, X: gag intact, uvula midline CN XI: sternocleidomastoid and trapezius muscles intact CN XII: tongue midline Bulk & Tone: normal, no fasciculations. Motor:  5/5 throughout  Sensation:   temperature and vibration sensation intact.  Deep Tendon Reflexes:  2+ throughout, toes downgoing.   Finger to nose testing:  Without dysmetria.   Heel to shin:  Without dysmetria.   Gait:  Normal station and stride.  Romberg negative.  IMPRESSION: 1.  Chronic migraine without aura, without status migrainosus, not intractable with chronic persistent headache, possibly complicated by medication overuse. 2.  Left amaurosis fugax.  Likely migraine aura but cannot rule out central retinal  artery occlusion (given her medical history). 3.  Remote history of "strokes".  They do not sound like cerebrovascular events (CVA or TIA) to me.  4.  Sickle cell anemia  PLAN: 1.  For preventative management, start Aimovig 70mg  monthly 2.  For abortive therapy of severe migraine, I gave her sample of Nurtec.  Given the possible amaurosis fugax, I would rather avoid triptans.  She has already failed two triptans anyway.  I would rather she avoid Percocet if possible. 3.  Limit use of pain relievers to no more than 2 days out of week to prevent risk of rebound or medication-overuse headache. 4. Check CTA of head and neck. 5. Due to possible amaurosis fugax, start ASA 81mg  daily  6.  Keep headache diary 7.  Exercise, hydration, caffeine cessation, sleep hygiene, monitor for and avoid triggers 8.  Consider:  magnesium citrate 400mg  daily, riboflavin 400mg  daily, and coenzyme Q10 100mg  three times daily 9. Follow up 4 months.   Thank you for allowing me to take part in the care of this patient.  Metta Clines, DO  CC: Debbrah Alar, NP

## 2019-10-21 ENCOUNTER — Other Ambulatory Visit: Payer: Self-pay

## 2019-10-21 ENCOUNTER — Ambulatory Visit (INDEPENDENT_AMBULATORY_CARE_PROVIDER_SITE_OTHER): Payer: Medicaid Other | Admitting: Neurology

## 2019-10-21 ENCOUNTER — Encounter: Payer: Self-pay | Admitting: Neurology

## 2019-10-21 VITALS — BP 126/80 | HR 92 | Ht 67.0 in | Wt 176.0 lb

## 2019-10-21 DIAGNOSIS — D5709 Hb-ss disease with crisis with other specified complication: Secondary | ICD-10-CM

## 2019-10-21 DIAGNOSIS — G43719 Chronic migraine without aura, intractable, without status migrainosus: Secondary | ICD-10-CM

## 2019-10-21 DIAGNOSIS — G453 Amaurosis fugax: Secondary | ICD-10-CM | POA: Diagnosis not present

## 2019-10-21 MED ORDER — NURTEC 75 MG PO TBDP: 1.0000 | ORAL_TABLET | Freq: Once | ORAL | 0 refills | Status: DC | PRN

## 2019-10-21 MED ORDER — AIMOVIG 70 MG/ML ~~LOC~~ SOAJ: 70.0000 mg | SUBCUTANEOUS | 11 refills | Status: DC

## 2019-10-21 NOTE — Patient Instructions (Signed)
1.  For preventative management, we will start Aimovig 70mg  injection every 30 days 2.  Limit use of pain relievers to no more than 2 days out of week to prevent risk of rebound or medication-overuse headache. 3.  Keep headache diary 4.  Exercise, hydration, caffeine cessation, sleep hygiene, monitor for and avoid triggers 5.  Consider:  magnesium citrate 400mg  daily, riboflavin 400mg  daily, and coenzyme Q10 100mg  three times daily 6. Always keep in mind that currently taking a hormone or birth control may be a possible trigger or aggravating factor for migraine. 7. Will check CTA of head and neck. 8. Follow up 4 months.

## 2019-10-25 ENCOUNTER — Encounter: Payer: Self-pay | Admitting: Gastroenterology

## 2019-10-25 ENCOUNTER — Telehealth: Payer: Self-pay | Admitting: *Deleted

## 2019-10-25 ENCOUNTER — Ambulatory Visit: Payer: Medicaid Other | Admitting: Gastroenterology

## 2019-10-25 VITALS — BP 108/60 | Temp 96.8°F | Ht 67.0 in | Wt 179.0 lb

## 2019-10-25 DIAGNOSIS — K642 Third degree hemorrhoids: Secondary | ICD-10-CM

## 2019-10-25 DIAGNOSIS — K219 Gastro-esophageal reflux disease without esophagitis: Secondary | ICD-10-CM | POA: Diagnosis not present

## 2019-10-25 DIAGNOSIS — K449 Diaphragmatic hernia without obstruction or gangrene: Secondary | ICD-10-CM | POA: Diagnosis not present

## 2019-10-25 DIAGNOSIS — K625 Hemorrhage of anus and rectum: Secondary | ICD-10-CM

## 2019-10-25 DIAGNOSIS — K645 Perianal venous thrombosis: Secondary | ICD-10-CM

## 2019-10-25 MED ORDER — HYDROCORTISONE ACETATE 25 MG RE SUPP: 25.0000 mg | Freq: Two times a day (BID) | RECTAL | 1 refills | Status: DC

## 2019-10-25 NOTE — Telephone Encounter (Signed)
This patient has a hemorrhoidal banding scheduled on 11/07/2019 and we need the ok for her to  have the procedure    [facility] [phone]

## 2019-10-25 NOTE — Progress Notes (Signed)
Shannon Roy    [id number]    [birth year]  Primary Care Physician:O'Sullivan, Lenna Sciara, NP  Referring Physician: Debbrah Alar, NP [address] [place],  [place] [address]   Chief complaint: Bright red blood per rectum, hemorrhoids, GERD  HPI:  33 year old female with history of sickle cell anemia here for new patient visit for evaluation of rectal bleeding and persistent GERD symptoms despite taking pantoprazole.  She has to take Mylanta 2-3 times a week for persistent heartburn.  Denies any dysphagia, odynophagia or abdominal pain.  She has been having blood per rectum almost daily with every bowel movement, when she wipes and also small volume in the toilet bowl.  Has been ongoing for a while now and that is the reason she underwent colonoscopy. She was taking Benefiber in the past but discontinued it. She has a mixture of soft and hard stool, sometimes has fecal urgency and comes out with a gush  EGD and colonoscopy Dec 26, 2016 at [facility] in [place] by Dr. Ottis Stain Sigmon Patient shared the report that she required access to her MyChart, 1 rectal diminutive polyp removed, benign appearing and internal hemorrhoids otherwise normal exam. We will try to obtain the pathology report and procedure reports from [place].  She follows with Dr. Clovis Riley, Hematologist at [facility] cancer care in [place] for her sickle cell disease and denies any recent episodes of crisis.   No family history of GI malignancy or IBD.  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  No acute abnormality.  EGD June 21, 2015 at [facility]: Normal esophagus, biopsies taken from GE  junction at 36 cm.  2 cm hiatal hernia.  Normal gastric and duodenal mucosa. Gastric biopsies showed mild chronic gastritis, negative for H. pylori, intestinal metaplasia or dysplasia.  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  Duodenal biopsies did not show any abnormality.  Outpatient Encounter Medications as of 10/25/2019  Medication Sig  . albuterol (VENTOLIN HFA) 108 (90 Base) MCG/ACT inhaler Inhale 1-2 puffs into the lungs every 6 (six) hours as needed for wheezing or shortness of breath.  . clobetasol cream (TEMOVATE) AB-123456789 % Apply 1 application topically daily.  Marland Kitchen EPINEPHrine 0.3 mg/0.3 mL IJ SOAJ injection Inject 0.3 mg into the muscle daily as needed for anaphylaxis.  [name] (AIMOVIG) 70 MG/ML SOAJ Inject 70 mg into the skin every 30 (thirty) days.  . folic acid (FOLVITE) 1 MG tablet Take 1 mg by mouth daily.  . hydroxypropyl methylcellulose / hypromellose (ISOPTO TEARS / GONIOVISC) 2.5 % ophthalmic solution Place 1 drop into both eyes 3 (three) times daily as needed for dry eyes.  Marland Kitchen oxyCODONE-acetaminophen (PERCOCET/ROXICET) 5-325 MG tablet Take 1-2 tablets by mouth every 4 (four) hours as needed for severe pain.  . pantoprazole (PROTONIX) 40 MG tablet Take 40 mg by mouth daily.  . Rimegepant Sulfate (NURTEC) 75 MG TBDP Take 1 tablet by mouth once as needed for up to 1 dose.  . SUMAtriptan (IMITREX) 50 MG tablet Take 1 tablet (50 mg total) by mouth every 2 (two) hours as needed for migraine. May repeat in 2 hours if  headache persists or recurs.  . triamcinolone cream (KENALOG) 0.1 % Apply 1 application topically 2 (two) times daily as needed (skin care).  . triamcinolone cream (KENALOG) 0.5 % Apply 1 application topically daily.   No facility-administered encounter medications on file as of 10/25/2019.     Allergies as of 10/25/2019 - Review Complete 10/21/2019  Allergen  Reaction Noted  . Bee venom Swelling 04/28/2019  . Pollen extract Other (See Comments) 04/28/2019    Past Medical History:  Diagnosis Date  . Asthma   . Blood transfusion without reported diagnosis   . Dermoid cyst    iguinal area  . Eczema   . Migraines   . Murmur, cardiac   . Sickle cell anemia (HCC)   . Stroke [facility])     Past Surgical History:  Procedure Laterality Date  . ANKLE SURGERY Right 2002   broken after a fall  . anklesurgery Right 2015   to have hardware removed  . CHOLECYSTECTOMY    . DERMOID CYST  EXCISION Bilateral    2018, removed from inguinal area  . TONSILLECTOMY      Family History  Problem Relation Age of Onset  . Diabetes Mother   . Sickle cell trait Sister   . Sickle cell trait Sister   . Depression Sister   . Breast cancer Maternal Aunt   . Diabetes Maternal Aunt   . Breast cancer Maternal Aunt   . Diabetes Maternal Aunt     Social History   Socioeconomic History  . Marital status: Single    Spouse name: Not on file  . Number of children: Not on file  . Years of education: Not on file  . Highest education level: Not on file  Occupational History  . Occupation: sousing annalist  Social Needs  . Financial resource strain: Not hard at all  . Food insecurity    Worry: Never true    Inability: Never true  . Transportation needs    Medical: No    Non-medical: No  Tobacco Use  . Smoking status: Never Smoker  . Smokeless tobacco: Never Used  Substance and Sexual Activity  . Alcohol use: Not Currently  . Drug use: Not Currently  . Sexual activity: Not Currently  Lifestyle  . Physical activity    Days per week: 3 days    Minutes per session: 60 min  . Stress: Not on file  Relationships  . Social connections    Talks on phone: More than three times a week    Gets together: Once a week    Attends religious service: Never    Active member of club or organization: Yes    Attends meetings of clubs or organizations: 1 to 4 times  per year    Relationship status: Not on file  . Intimate partner violence    Fear of current or ex partner: Not on file    Emotionally abused: Not on file    Physically abused: Not on file    Forced sexual activity: Not on file  Other Topics Concern  . Not on file  Social History Narrative   No children   [employer]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   Lives with her youngest sister   Cat and a dog   Enjoys hiking, photography, restaurants   Left handed   Two story home      Review of systems: Review of Systems  Constitutional: Negative for fever and chills.  HENT: Negative.   Eyes: Negative  for blurred vision.  Respiratory: Negative for cough, shortness of breath and wheezing.   Cardiovascular: Negative for chest pain and palpitations.  Gastrointestinal: as per HPI Genitourinary: Negative for dysuria, urgency, frequency and hematuria.  Musculoskeletal: Negative for myalgias, back pain and joint pain.  Skin: Negative for itching and rash.  Neurological: Negative for dizziness, tremors, focal weakness, seizures and loss of consciousness.  Endo/Heme/Allergies: Positive for seasonal allergies.  Psychiatric/Behavioral: Negative for depression, suicidal ideas and hallucinations.  All other systems reviewed and are negative.   Physical Exam: Vitals:   10/25/19 0849  BP: 108/60  Temp: (!) 96.8 F (36 C)   Body mass index is 28.04 kg/m. Gen:      No acute distress HEENT:  EOMI, sclera anicteric Neck:     No masses; no thyromegaly Lungs:    Clear to auscultation bilaterally; normal respiratory effort CV:         Regular rate and rhythm; no murmurs Abd:      + bowel sounds; soft, non-tender; no palpable masses, no distension Ext:    No edema; adequate peripheral perfusion Skin:      Warm and dry; no rash Neuro: alert and oriented x 3 Psych: normal mood and affect Rectal exam: Normal anal sphincter tone, no anal fissure , + enlarged external and protruding internal hemorrhoids, right  anterior hemorrhoid thrombosed externally  Data Reviewed:  Reviewed labs, radiology imaging, old records and pertinent past GI work up   Assessment and Plan/Recommendations:  33 year old female with history of sickle cell anemia with symptomatic internal and external hemorrhoids, small-volume rectal bleeding, hiatal hernia and GERD  Thrombosed external hemorrhoids and bleeding internal hemorrhoids: We will treat conservatively with sitz bath twice daily and Anusol suppository twice daily for next 5 to 7 days Benefiber 1 tablespoon 3 times daily with meals  If continues to have persistent rectal bleeding, will consider internal hemorrhoid band ligation. Will request clearance from Dr. Clovis Riley prior to hemorrhoidal band ligation to make sure that she is not at risk for sickle cell crisis with the procedure.  We will obtain records of prior colonoscopy and EGD from [facility] to review  GERD: Increase Protonix 40 mg twice daily, 30 minutes before breakfast and dinner Discussed antireflux measures and lifestyle modifications  Return in 2 to 4 weeks or sooner if needed  Greater than 50% of the time used for counseling as well as treatment plan and follow-up. She had multiple questions which were answered to her satisfaction  K. Denzil Magnuson , MD    CC: Debbrah Alar, NP

## 2019-10-25 NOTE — Patient Instructions (Addendum)
Please send your previous colonoscopy/pathology reports to Dr Silverio Decamp at   [email or web address]   You have been scheduled for your hemorrhoidal banding on 11/30 at 3:50pm   Take benefiber 1 tablespoon three times a day with meals  Increase Protonix to twice daily   Gastroesophageal Reflux Disease, Adult Gastroesophageal reflux (GER) happens when acid from the stomach flows up into the tube that connects the mouth and the stomach (esophagus). Normally, food travels down the esophagus and stays in the stomach to be digested. However, when a person has GER, food and stomach acid sometimes move back up into the esophagus. If this becomes a more serious problem, the person may be diagnosed with a disease called gastroesophageal reflux disease (GERD). GERD occurs when the reflux:  Happens often.  Causes frequent or severe symptoms.  Causes problems such as damage to the esophagus. When stomach acid comes in contact with the esophagus, the acid may cause soreness (inflammation) in the esophagus. Over time, GERD may create small holes (ulcers) in the lining of the esophagus. What are the causes? This condition is caused by a problem with the muscle between the esophagus and the stomach (lower esophageal sphincter, or LES). Normally, the LES muscle closes after food passes through the esophagus to the stomach. When the LES is weakened or abnormal, it does not close properly, and that allows food and stomach acid to go back up into the esophagus. The LES can be weakened by certain dietary substances, medicines, and medical conditions, including:  Tobacco use.  Pregnancy.  Having a hiatal hernia.  Alcohol use.  Certain foods and beverages, such as coffee, chocolate, onions, and peppermint. What increases the risk? You are more likely to develop this condition if you:  Have an increased body weight.  Have a connective tissue disorder.  Use NSAID medicines. What are the signs  or symptoms? Symptoms of this condition include:  Heartburn.  Difficult or painful swallowing.  The feeling of having a lump in the throat.  Abitter taste in the mouth.  Bad breath.  Having a large amount of saliva.  Having an upset or bloated stomach.  Belching.  Chest pain. Different conditions can cause chest pain. Make sure you see your health care provider if you experience chest pain.  Shortness of breath or wheezing.  Ongoing (chronic) cough or a night-time cough.  Wearing away of tooth enamel.  Weight loss. How is this diagnosed? Your health care provider will take a medical history and perform a physical exam. To determine if you have mild or severe GERD, your health care provider may also monitor how you respond to treatment. You may also have tests, including:  A test to examine your stomach and esophagus with a small camera (endoscopy).  A test thatmeasures the acidity level in your esophagus.  A test thatmeasures how much pressure is on your esophagus.  A barium swallow or modified barium swallow test to show the shape, size, and functioning of your esophagus. How is this treated? The goal of treatment is to help relieve your symptoms and to prevent complications. Treatment for this condition may vary depending on how severe your symptoms are. Your health care provider may recommend:  Changes to your diet.  Medicine.  Surgery. Follow these instructions at home: Eating and drinking   Follow a diet as recommended by your health care provider. This may involve avoiding foods and drinks such as: ? Coffee and tea (with or without caffeine). ? Drinks that containalcohol. ? Energy  drinks and sports drinks. ? Carbonated drinks or sodas. ? Chocolate and cocoa. ? Peppermint and mint flavorings. ? Garlic and onions. ? Horseradish. ? Spicy and acidic foods, including peppers, chili powder, curry powder, vinegar, hot sauces, and barbecue sauce. ? Citrus  fruit juices and citrus fruits, such as oranges, lemons, and limes. ? Tomato-based foods, such as red sauce, chili, salsa, and pizza with red sauce. ? Fried and fatty foods, such as donuts, french fries, potato chips, and high-fat dressings. ? High-fat meats, such as hot dogs and fatty cuts of red and white meats, such as rib eye steak, sausage, ham, and bacon. ? High-fat dairy items, such as whole milk, butter, and cream cheese.  Eat small, frequent meals instead of large meals.  Avoid drinking large amounts of liquid with your meals.  Avoid eating meals during the 2-3 hours before bedtime.  Avoid lying down right after you eat.  Do not exercise right after you eat. Lifestyle   Do not use any products that contain nicotine or tobacco, such as cigarettes, e-cigarettes, and chewing tobacco. If you need help quitting, ask your health care provider.  Try to reduce your stress by using methods such as yoga or meditation. If you need help reducing stress, ask your health care provider.  If you are overweight, reduce your weight to an amount that is healthy for you. Ask your health care provider for guidance about a safe weight loss goal. General instructions  Pay attention to any changes in your symptoms.  Take over-the-counter and prescription medicines only as told by your health care provider. Do not take aspirin, ibuprofen, or other NSAIDs unless your health care provider told you to do so.  Wear loose-fitting clothing. Do not wear anything tight around your waist that causes pressure on your abdomen.  Raise (elevate) the head of your bed about 6 inches (15 cm).  Avoid bending over if this makes your symptoms worse.  Keep all follow-up visits as told by your health care provider. This is important. Contact a health care provider if:  You have: ? New symptoms. ? Unexplained weight loss. ? Difficulty swallowing or it hurts to swallow. ? Wheezing or a persistent cough. ? A  hoarse voice.  Your symptoms do not improve with treatment. Get help right away if you:  Have pain in your arms, neck, jaw, teeth, or back.  Feel sweaty, dizzy, or light-headed.  Have chest pain or shortness of breath.  Vomit and your vomit looks like blood or coffee grounds.  Faint.  Have stool that is bloody or black.  Cannot swallow, drink, or eat. Summary  Gastroesophageal reflux happens when acid from the stomach flows up into the esophagus. GERD is a disease in which the reflux happens often, causes frequent or severe symptoms, or causes problems such as damage to the esophagus.  Treatment for this condition may vary depending on how severe your symptoms are. Your health care provider may recommend diet and lifestyle changes, medicine, or surgery.  Contact a health care provider if you have new or worsening symptoms.  Take over-the-counter and prescription medicines only as told by your health care provider. Do not take aspirin, ibuprofen, or other NSAIDs unless your health care provider told you to do so.  Keep all follow-up visits as told by your health care provider. This is important. This information is not intended to replace advice given to you by your health care provider. Make sure you discuss any questions you have with  your health care provider. Document Released: 09/03/2005 Document Revised: 06/02/2018 Document Reviewed: 06/02/2018 Elsevier Patient Education  2020 [employer].    How to Take a [employer] A sitz bath is a warm water bath that may be used to care for your rectum, genital area, or the area between your rectum and genitals (perineum). For a sitz bath, the water only comes up to your hips and covers your buttocks. A sitz bath may done at home in a bathtub or with a portable sitz bath that fits over the toilet. Your health care provider may recommend a sitz bath to help:  Relieve pain and discomfort after delivering a baby.  Relieve pain and  itching from hemorrhoids or anal fissures.  Relieve pain after certain surgeries.  Relax muscles that are sore or tight. How to take a sitz bath Take 3-4 sitz baths a day, or as many as told by your health care provider. Bathtub sitz bath To take a sitz bath in a bathtub: 1. Partially fill a bathtub with warm water. The water should be deep enough to cover your hips and buttocks when you are sitting in the tub. 2. If your health care provider told you to put medicine in the water, follow his or her instructions. 3. Sit in the water. 4. Open the tub drain a little, and leave it open during your bath. 5. Turn on the warm water again, enough to replace the water that is draining out. Keep the water running throughout your bath. This helps keep the water at the right level and the right temperature. 6. Soak in the water for 15-20 minutes, or as long as told by your health care provider. 7. When you are done, be careful when you stand up. You may feel dizzy. 8. After the sitz bath, pat yourself dry. Do not rub your skin to dry it.  Over-the-toilet sitz bath To take a sitz bath with an over-the-toilet basin: 1. Follow the manufacturer's instructions. 2. Fill the basin with warm water. 3. If your health care provider told you to put medicine in the water, follow his or her instructions. 4. Sit on the seat. Make sure the water covers your buttocks and perineum. 5. Soak in the water for 15-20 minutes, or as long as told by your health care provider. 6. After the sitz bath, pat yourself dry. Do not rub your skin to dry it. 7. Clean and dry the basin between uses. 8. Discard the basin if it cracks, or according to the manufacturer's instructions. Contact a health care provider if:  Your symptoms get worse. Do not continue with sitz baths if your symptoms get worse.  You have new symptoms. If this happens, do not continue with sitz baths until you talk with your health care provider. Summary   A sitz bath is a warm water bath in which the water only comes up to your hips and covers your buttocks.  A sitz bath may help relieve itching, relieve pain, and relax muscles that are sore or tight in the lower part of your body, including your genital area.  Take 3-4 sitz baths a day, or as many as told by your health care provider. Soak in the water for 15-20 minutes.  Do not continue with sitz baths if your symptoms get worse. This information is not intended to replace advice given to you by your health care provider. Make sure you discuss any questions you have with your health care provider. Document Released:  08/16/2004 Document Revised: 11/26/2017 Document Reviewed: 11/26/2017 Elsevier Patient Education  [employer].   If you are age 66 or older, your body mass index should be between 23-30. Your Body mass index is 28.04 kg/m. If this is out of the aforementioned range listed, please consider follow up with your Primary Care Provider.  If you are age 80 or younger, your body mass index should be between 19-25. Your Body mass index is 28.04 kg/m. If this is out of the aformentioned range listed, please consider follow up with your Primary Care Provider.     I appreciate the  opportunity to care for you  Thank You   Harl Bowie , MD

## 2019-10-31 NOTE — Telephone Encounter (Signed)
Per fax patient is cleared from Hematology standpoint  Will send to be scanned in

## 2019-10-31 NOTE — Telephone Encounter (Signed)
Called Dr Epifanio Lesches office at [place] with Nurse who will give him the message and contact me back by phone today  To make sure its ok that she have the banding done   Also faxed request to [phone]

## 2019-11-07 ENCOUNTER — Encounter: Payer: Self-pay | Admitting: Gastroenterology

## 2019-11-07 ENCOUNTER — Ambulatory Visit (INDEPENDENT_AMBULATORY_CARE_PROVIDER_SITE_OTHER): Payer: Medicaid Other | Admitting: Gastroenterology

## 2019-11-07 VITALS — BP 110/72 | HR 75 | Temp 97.9°F | Ht 67.0 in | Wt 176.0 lb

## 2019-11-07 DIAGNOSIS — K602 Anal fissure, unspecified: Secondary | ICD-10-CM | POA: Diagnosis not present

## 2019-11-07 DIAGNOSIS — K649 Unspecified hemorrhoids: Secondary | ICD-10-CM

## 2019-11-07 MED ORDER — AMBULATORY NON FORMULARY MEDICATION: 1 refills | Status: DC

## 2019-11-07 NOTE — Progress Notes (Signed)
PROCEDURE NOTE: The patient presents with symptomatic grade 2-3 hemorrhoids, requesting rubber band ligation of his/her hemorrhoidal disease.  All risks, benefits and alternative forms of therapy were described and informed consent was obtained.  In the Left Lateral Decubitus position anoscopic examination revealed anterior anal fissure and grade 2 hemorrhoids in the right anterior, right posterior and left lateral position(s).  The anorectum was pre-medicated with RectiCare and 0.125% nitroglycerin The decision was made to not proceed with hemorrhoidal band ligation   The following  treatments were recommended:  Benefiber 1 tablespoon 3 times daily with meals Nitroglycerin 0.125% 3 times daily, small pea-sized amount per rectum for 6 to 8 weeks Avoid excessive straining with defecation  The patient will return in 6 to 8 weeks for  follow-up and possible hemorrhoidal banding as required.   Damaris Hippo , MD [phone]

## 2019-11-07 NOTE — Patient Instructions (Addendum)
FOLLOW-UP CARE for anal fissure    1. Avoid strenuous exercise.  A sitz bath (soaking in a warm tub) or bidet is soothing, and can be useful for cleansing the area after bowel movements.     2. To avoid constipation, take two teaspoons three times daily of natural wheat bran, natural oat bran, flax, Benefiber or any over the counter fiber supplement and increase your water intake to 8-10 glasses daily.  Do Miralax 1 capful daily and titrate as needed to have daily soft bowel movements   3. Do not stay seated continuously for more than 2-3 hours for a day or two after the procedure.  Tighten your buttock muscles 10-15 times every two hours and take 10-15 deep breaths every 1-2 hours.  Do not spend more than a few minutes on the toilet if you cannot empty your bowel; instead re-visit the toilet at a later time  4          Use suppositories per rectum daily for 1 week as needed    We have sent a prescription for nitroglycerin 0.125% gel to [facility]. You should apply a pea size amount to your rectum three times daily x 6-8 weeks.  [facility]'s information is below: Address: [address], [place], [place] [address]  [phone]  *Please DO NOT go directly from our office to pick up this medication! Give the pharmacy 1 day to process the prescription as this is compounded and takes time to make.  Take benefiber 1 tablespoon three times a day with meals   How to Take a Sitz Bath A sitz bath is a warm water bath that may be used to care for your rectum, genital area, or the area between your rectum and genitals (perineum). For a sitz bath, the water only comes up to your hips and covers your buttocks. A sitz bath may done at home in a bathtub or with a portable sitz bath that fits over the toilet. Your health care provider may recommend a sitz bath to help:  Relieve pain and discomfort after delivering a baby.  Relieve pain and itching from hemorrhoids or  anal fissures.  Relieve pain after certain surgeries.  Relax muscles that are sore or tight. How to take a sitz bath Take 3-4 sitz baths a day, or as many as told by your health care provider. Bathtub sitz bath To take a sitz bath in a bathtub: 1. Partially fill a bathtub with warm water. The water should be deep enough to cover your hips and buttocks when you are sitting in the tub. 2. If your health care provider told you to put medicine in the water, follow his or her instructions. 3. Sit in the water. 4. Open the tub drain a little, and leave it open during your bath. 5. Turn on the warm water again, enough to replace the water that is draining out. Keep the water running throughout your bath. This helps keep the water at the right level and the right temperature. 6. Soak in the water for 15-20 minutes, or as long as told by your health care provider. 7. When you are done, be careful when you stand up. You may feel dizzy. 8. After the sitz bath, pat yourself dry. Do not rub your skin to dry it.  Over-the-toilet sitz bath To take a sitz bath with an over-the-toilet basin: 1. Follow the manufacturer's instructions. 2. Fill the basin with warm water. 3.  If your health care provider told you to put medicine in the water, follow his or her instructions. 4. Sit on the seat. Make sure the water covers your buttocks and perineum. 5. Soak in the water for 15-20 minutes, or as long as told by your health care provider. 6. After the sitz bath, pat yourself dry. Do not rub your skin to dry it. 7. Clean and dry the basin between uses. 8. Discard the basin if it cracks, or according to the manufacturer's instructions. Contact a health care provider if:  Your symptoms get worse. Do not continue with sitz baths if your symptoms get worse.  You have new symptoms. If this happens, do not continue with sitz baths until you talk with your health care provider. Summary  A sitz bath is a warm water  bath in which the water only comes up to your hips and covers your buttocks.  A sitz bath may help relieve itching, relieve pain, and relax muscles that are sore or tight in the lower part of your body, including your genital area.  Take 3-4 sitz baths a day, or as many as told by your health care provider. Soak in the water for 15-20 minutes.  Do not continue with sitz baths if your symptoms get worse. This information is not intended to replace advice given to you by your health care provider. Make sure you discuss any questions you have with your health care provider. Document Released: 08/16/2004 Document Revised: 11/26/2017 Document Reviewed: 11/26/2017 Elsevier Patient Education  [employer].     I appreciate the  opportunity to care for you  Thank You   Harl Bowie , MD

## 2020-03-09 ENCOUNTER — Ambulatory Visit: Payer: Medicaid Other | Admitting: Neurology

## 2020-03-26 NOTE — Progress Notes (Signed)
Virtual Visit via Video Note The purpose of this virtual visit is to provide medical care while limiting exposure to the novel coronavirus.    Consent was obtained for video visit:  Yes.   Answered questions that patient had about telehealth interaction:  Yes.   I discussed the limitations, risks, security and privacy concerns of performing an evaluation and management service by telemedicine. I also discussed with the patient that there may be a patient responsible charge related to this service. The patient expressed understanding and agreed to proceed.  Pt location: Home Physician Location: office Name of referring provider:  Debbrah Alar, NP I connected with Shannon Roy at patients initiation/request on 03/28/2020 at  7:50 AM EDT by video enabled telemedicine application and verified that I am speaking with the correct person using two identifiers. Pt MRN:  [id number] Pt DOB:  08-03-1986 Video Participants:  Shannon Roy   History of Present Illness:  Shannon Roy is a 34 year old left-handed female with sickle cell anemia with history of stroke, asthma and migraines who follows up for migraines and history of CVA.  UPDATE: Due to possible amaurosis fugax, CTA of head and neck were ordered but not performed because she never received a call to schedule it.  He was advised to start ASA 81mg  daily.  Aimovig started in December.  She has been on it for 5 months.  Initially, it was helpful.  By February, she was averaging 1 headache every other week.  Then they started to increase.  In the past 3 weeks, she has had one every 2 days.  They are severe and lasting 2-3 hours.  Nurtec is ineffective.  She hasn't been sleeping well, which may be a trigger.  She is only sleeping 4 to 5 hours a night.    Rescue:  Switches between [facility] and Aleve (about 2 days a week) Current NSAIDS:  Aleve; ASA 81mg  Current analgesics:  Percocet; BC powder Current triptans:  none Current ergotamine:   none Current anti-emetic:  none Current muscle relaxants:  none Current anti-anxiolytic:  none Current sleep aide:  none Current Antihypertensive medications:  none Current Antidepressant medications:  none Current Anticonvulsant medications:  none Current anti-CGRP:  Aimovig 70mg  Current Vitamins/Herbal/Supplements:  none Current Antihistamines/Decongestants:  none Other therapy:  none Hormone/birth control:  none  Caffeine:  1 cup of coffee 10 days a month; 1 cup of tea 10 days a month; drinks a Coke 2 days a month Diet:  6 bottles of water daily Exercise:  Not routine Depression:  Moderate depression; Anxiety:  yes Other pain:  Sickle cell crisis infrequently Sleep hygiene:  Poor.  Difficulty falling asleep.  HISTORY: She started having a persistent headache since age 24.  She has had multiple MRIs in the past.  Headaches vary (may be unilateral either side, frontal or back of neck.  It is pounding.  They are typically at 2-3/10 but they may become 8-10/10 usually lasting 1 to 4 hours and occur once a month.  Typical associated symptoms include burning on the head, photophobia, phonophobia, nausea, osmophobia.  No associated vomiting, visual disturbance or unilateral numbness or weakness.  No identifiable trigger.  Vomiting and resting in dark room helps relieve.  She went to the ED on 09/07/2019 because she had a severe left sided migraine associated with complete vision loss of left eye for 20-30 minutes followed by blurred vision in the left eye and tingling around the eye.  Blurred vision lasted 8 hours.  MRI of brain with and without contrast from 09/07/2019 was personally reviewed and was normal.  She sometimes has positional vertigo.  Other history:  She reports that in 6th grade, she began having syncopal spells in which she vision blacked out and then lost consciousness.  At the time, she was told they were "mini strokes".  Episodes resolved after about 1 year.  Rescue  therapy:  Aleve and BC for mild to moderate headache; percocet for severe headache. Frequency of analgesics:  Aleve and BC daily.   Past NSAIDS:  Ibuprofen, naproxen, flurbiprofen Past analgesics:  Fioricet; oxycodone Past abortive triptans:  Maxalt 10mg ; sumatriptan 50mg  Past abortive ergotamine:  none Past muscle relaxants:  Robaxin Past anti-emetic:  Reglan Past antihypertensive medications:  none Past antidepressant medications:  Amitriptyline (ineffective, drowsiness) Past anticonvulsant medications:  Topiramate (ineffective) Past anti-CGRP:  Nurtec Past vitamins/Herbal/Supplements:  none Past antihistamines/decongestants:  none Other past therapies:  none   Family history of headache:  Mom (migraines), 2 sisters (migraines), 3 maternal aunts (migraines)  Past Medical History: Past Medical History:  Diagnosis Date  . Asthma   . Blood transfusion without reported diagnosis   . Dermoid cyst    iguinal area  . Eczema   . Migraines   . Murmur, cardiac   . Sickle cell anemia (HCC)   . Stroke [facility])     Medications: Outpatient Encounter Medications as of 03/28/2020  Medication Sig  . albuterol (VENTOLIN HFA) 108 (90 Base) MCG/ACT inhaler Inhale 1-2 puffs into the lungs every 6 (six) hours as needed for wheezing or shortness of breath.  . AMBULATORY NON FORMULARY MEDICATION Medication Name: Use Nitroglycerin 0.125% pea sized amount three times a day for 6-8 weeks  . clobetasol cream (TEMOVATE) AB-123456789 % Apply 1 application topically daily.  Marland Kitchen EPINEPHrine 0.3 mg/0.3 mL IJ SOAJ injection Inject 0.3 mg into the muscle daily as needed for anaphylaxis.  [name] (AIMOVIG) 70 MG/ML SOAJ Inject 70 mg into the skin every 30 (thirty) days.  . folic acid (FOLVITE) 1 MG tablet Take 1 mg by mouth daily.  . hydrocortisone (ANUSOL-HC) 25 MG suppository Place 1 suppository (25 mg total) rectally 2 (two) times daily.  . hydroxypropyl methylcellulose / hypromellose (ISOPTO TEARS /  GONIOVISC) 2.5 % ophthalmic solution Place 1 drop into both eyes 3 (three) times daily as needed for dry eyes.  Marland Kitchen oxyCODONE-acetaminophen (PERCOCET/ROXICET) 5-325 MG tablet Take 1-2 tablets by mouth every 4 (four) hours as needed for severe pain.  . pantoprazole (PROTONIX) 40 MG tablet Take 40 mg by mouth daily.  . Rimegepant Sulfate (NURTEC) 75 MG TBDP Take 1 tablet by mouth once as needed for up to 1 dose.  . SUMAtriptan (IMITREX) 50 MG tablet Take 1 tablet (50 mg total) by mouth every 2 (two) hours as needed for migraine. May repeat in 2 hours if headache persists or recurs.  . triamcinolone cream (KENALOG) 0.1 % Apply 1 application topically 2 (two) times daily as needed (skin care).  . triamcinolone cream (KENALOG) 0.5 % Apply 1 application topically daily.   No facility-administered encounter medications on file as of 03/28/2020.    Allergies: Allergies  Allergen Reactions  . Bee Venom Swelling  . Pollen Extract Other (See Comments)    Seasonal allergies     Family History: Family History  Problem Relation Age of Onset  . Diabetes Mother   . Sickle cell trait Sister   . Sickle cell trait Sister   . Depression Sister   . Breast  cancer Maternal Aunt   . Diabetes Maternal Aunt   . Breast cancer Maternal Aunt   . Diabetes Maternal Aunt     Social History: Social History   Socioeconomic History  . Marital status: Single    Spouse name: Not on file  . Number of children: 0  . Years of education: Not on file  . Highest education level: Not on file  Occupational History  . 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 Smoking status: Never Smoker  . Smokeless tobacco: Never Used  Substance and Sexual Activity  . Alcohol use: Not Currently  . Drug use: Not Currently  . Sexual activity: Not Currently  Other Topics Concern  . Not on file  Social History Narrative   No children   [employer]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   Lives with her youngest sister   Cat and a dog   Enjoys  hiking, photography, restaurants   Left handed   Two story home   Social Determinants of Health   Financial Resource Strain: Low Risk   . Difficulty of Paying Living Expenses: Not hard at all  Food Insecurity: No Food Insecurity  . Worried About Charity fundraiser in the Last Year: Never true  . Ran Out of Food in the Last Year: Never true  Transportation Needs: No Transportation Needs  . Lack of Transportation (Medical): No  . Lack of Transportation (Non-Medical): No  Physical Activity: Sufficiently Active  . Days of Exercise per Week: 3 days  . Minutes of Exercise per Session: 60 min  Stress:   . Feeling of Stress :   Social Connections: Unknown  . Frequency of Communication with Friends and Family: More than three times a week  . Frequency of Social Gatherings with Friends and Family: Once a week  . Attends Religious Services: Never  . Active Member of Clubs or Organizations: Yes  . Attends Archivist Meetings: 1 to 4 times per year  . Marital Status: Not on file  Intimate Partner Violence:   . Fear of Current or Ex-Partner:   . Emotionally Abused:   Marland Kitchen Physically Abused:   . Sexually Abused:     Observations/Objective:   There were no vitals taken for this visit. No acute distress.  Alert and oriented.  Speech fluent and not dysarthric.  Language intact.  Eyes orthophoric on primary gaze.  Face symmetric.  Assessment and Plan:   1.  Chronic migraine without aura, without status migrainosus, not intractable 2.  Left amaurosis fugax.  No recurrence.  Likely migraine aura but cannot rule central retinal artery occlusion given the negative visual symptom and medical history 3.  Sickle cell anema  1.  For preventative management, increase Aimovig to 140mg  every 28 days 2.  For abortive therapy, she will pick up samples of Ubrelvy 100mg .  Again, given the possible amaurosis fugax, I would rather avoid triptans.  She has already failed two triptans  3.  Limit use of  pain relievers to no more than 2 days out of week to prevent risk of rebound or medication-overuse headache. 4.  Keep headache diary 5. Will put in another order for CTA of head and neck for further evaluation of left amaurosis fugax.  She will contact us if she hasn't heard from anyone in a week.  She is taking ASA 81mg  daily. 6.  Exercise, hydration, caffeine cessation, sleep hygiene, monitor for and avoid triggers 7. Discussed sleep hygiene and exercise to fall asleep  faster.  8. Follow up 4 months.   Follow Up Instructions:    -I discussed the assessment and treatment plan with the patient. The patient was provided an opportunity to ask questions and all were answered. The patient agreed with the plan and demonstrated an understanding of the instructions.   The patient was advised to call back or seek an in-person evaluation if the symptoms worsen or if the condition fails to improve as anticipated.      Dudley Major, DO

## 2020-03-27 ENCOUNTER — Telehealth: Payer: Self-pay | Admitting: Gastroenterology

## 2020-03-27 NOTE — Telephone Encounter (Signed)
Pt needs to schedule banding. She stated that it was recommended by Dr. Silverio Decamp.

## 2020-03-27 NOTE — Telephone Encounter (Signed)
Spoke with the patient and scheduled her 2nd banding. New migraine medication Aimovig. No anti-coagulants.  Patient asked if her sister would be allowed to accompany her. States she is extremely nervous and really wants her with her.  Advised visitors are not allowed at this point.

## 2020-03-28 ENCOUNTER — Encounter: Payer: Self-pay | Admitting: Neurology

## 2020-03-28 ENCOUNTER — Other Ambulatory Visit: Payer: Self-pay

## 2020-03-28 ENCOUNTER — Telehealth (INDEPENDENT_AMBULATORY_CARE_PROVIDER_SITE_OTHER): Payer: Medicaid Other | Admitting: Neurology

## 2020-03-28 DIAGNOSIS — G453 Amaurosis fugax: Secondary | ICD-10-CM

## 2020-03-28 DIAGNOSIS — G43009 Migraine without aura, not intractable, without status migrainosus: Secondary | ICD-10-CM | POA: Diagnosis not present

## 2020-03-28 DIAGNOSIS — D5709 Hb-ss disease with crisis with other specified complication: Secondary | ICD-10-CM | POA: Diagnosis not present

## 2020-03-28 MED ORDER — AIMOVIG 140 MG/ML ~~LOC~~ SOAJ: 140.0000 mg | SUBCUTANEOUS | 11 refills | Status: DC

## 2020-04-13 ENCOUNTER — Ambulatory Visit (INDEPENDENT_AMBULATORY_CARE_PROVIDER_SITE_OTHER): Payer: Medicaid Other | Admitting: Gastroenterology

## 2020-04-13 ENCOUNTER — Ambulatory Visit
Admission: RE | Admit: 2020-04-13 | Discharge: 2020-04-13 | Disposition: A | Discharge status: Home / Self Care | Payer: Medicaid Other | Source: Ambulatory Visit | Attending: Neurology | Admitting: Neurology

## 2020-04-13 ENCOUNTER — Other Ambulatory Visit: Payer: Self-pay

## 2020-04-13 ENCOUNTER — Encounter: Payer: Self-pay | Admitting: Gastroenterology

## 2020-04-13 VITALS — BP 120/60 | HR 84 | Temp 98.2°F | Ht 67.0 in | Wt 178.0 lb

## 2020-04-13 DIAGNOSIS — K641 Second degree hemorrhoids: Secondary | ICD-10-CM | POA: Diagnosis not present

## 2020-04-13 DIAGNOSIS — K602 Anal fissure, unspecified: Secondary | ICD-10-CM | POA: Diagnosis not present

## 2020-04-13 DIAGNOSIS — G453 Amaurosis fugax: Secondary | ICD-10-CM

## 2020-04-13 MED ORDER — AMBULATORY NON FORMULARY MEDICATION: 0 refills | Status: DC

## 2020-04-13 MED ORDER — IOPAMIDOL (ISOVUE-370) INJECTION 76%
75.0000 mL | Freq: Once | INTRAVENOUS | Status: AC | PRN
  Administered 2020-04-13: 75 mL via INTRAVENOUS

## 2020-04-13 MED ORDER — HYDROCORTISONE ACETATE 25 MG RE SUPP
25.0000 mg | Freq: Every day | RECTAL | 0 refills | Status: DC
Start: 2020-04-13 — End: 2020-04-24

## 2020-04-13 NOTE — Patient Instructions (Signed)
If you are age 36 or older, your body mass index should be between 23-30. Your Body mass index is 27.88 kg/m. If this is out of the aforementioned range listed, please consider follow up with your Primary Care Provider.  If you are age 1 or younger, your body mass index should be between 19-25. Your Body mass index is 27.88 kg/m. If this is out of the aformentioned range listed, please consider follow up with your Primary Care Provider.    We have sent the following medications to your pharmacy for you to pick up at your convenience:  anusol suppositories Nitroglycerin 0.125 % ointment  Return to the clinic in 2-3 months Please call to schedule this.

## 2020-04-13 NOTE — Progress Notes (Signed)
Shannon Roy    [id number]    [birth year]  Primary Care Physician:O'Sullivan, Lenna Sciara, NP  Referring Physician: Debbrah Alar, NP [address] [place],  [place] [address]   Chief complaint:  BRBPR  HPI:  69 yr F with history of sickle cell anemia here for follow-up visit for anal fissure and symptomatic hemorrhoids  She is no longer having bright red blood per rectum.  Continues to have intermittent rectal discomfort especially when she sits for prolonged period of time but does not have any discomfort during defecation.   Outpatient Encounter Medications as of 04/13/2020  Medication Sig  . albuterol (VENTOLIN HFA) 108 (90 Base) MCG/ACT inhaler Inhale 1-2 puffs into the lungs every 6 (six) hours as needed for wheezing or shortness of breath.  . AMBULATORY NON FORMULARY MEDICATION Medication Name: Use Nitroglycerin 0.125% pea sized amount three times a day for 6-8 weeks  . clobetasol cream (TEMOVATE) [id number] % Apply 1 application topically daily.  Marland Kitchen EPINEPHrine 0.3 mg/0.3 mL IJ SOAJ injection Inject 0.3 mg into the muscle daily as needed for anaphylaxis.  [name] (AIMOVIG) 140 MG/ML SOAJ Inject 140 mg into the skin every 28 (twenty-eight) days.  . folic acid (FOLVITE) 1 MG tablet Take 1 mg by mouth daily.  . hydrocortisone (ANUSOL-HC) 25 MG suppository Place 1 suppository (25 mg total) rectally 2 (two) times daily.  . hydroxypropyl methylcellulose / hypromellose (ISOPTO TEARS / GONIOVISC) 2.5 % ophthalmic solution Place 1 drop into both eyes 3 (three) times daily as needed for dry eyes.  Marland Kitchen oxyCODONE-acetaminophen (PERCOCET/ROXICET) 5-325 MG tablet Take 1-2 tablets by mouth every 4 (four) hours as needed for severe pain.  . pantoprazole (PROTONIX) 40 MG tablet Take 40 mg by mouth daily.  Marland Kitchen triamcinolone cream (KENALOG) 0.1 % Apply 1 application topically 2 (two) times daily as needed (skin care).  . triamcinolone cream (KENALOG) 0.5 % Apply 1  application topically daily.  . [DISCONTINUED] Rimegepant Sulfate (NURTEC) 75 MG TBDP Take 1 tablet by mouth once as needed for up to 1 dose.  . [DISCONTINUED] SUMAtriptan (IMITREX) 50 MG tablet Take 1 tablet (50 mg total) by mouth every 2 (two) hours as needed for migraine. May repeat in 2 hours if headache persists or recurs. (Patient not taking: Reported on 03/27/2020)   No facility-administered encounter medications on file as of 04/13/2020.    Allergies as of 04/13/2020 - Review Complete 04/13/2020  Allergen Reaction Noted  . Bee venom Swelling 04/28/2019  . Pollen extract Other (See Comments) 04/28/2019    Past Medical History:  Diagnosis Date  . Anal fissure   . Asthma   . Blood transfusion without reported diagnosis   . Dermoid cyst    iguinal area  . Eczema   . Migraines   . Murmur, cardiac   . Sickle cell anemia (HCC)   . Stroke [facility])     Past Surgical History:  Procedure Laterality Date  . ANKLE SURGERY Right 2002   broken after a fall  . anklesurgery Right 2015   to have hardware removed  . CHOLECYSTECTOMY    . DERMOID CYST  EXCISION Bilateral    2018, removed from inguinal area  . TONSILLECTOMY      Family History  Problem Relation Age of Onset  . Diabetes Mother   . Sickle cell trait Sister   . Sickle cell trait Sister   . Depression Sister   . Breast cancer  Maternal Aunt   . Diabetes Maternal Aunt   . Breast cancer Maternal Aunt   . Diabetes Maternal Aunt   . Colon cancer Neg Hx   . Stomach cancer Neg Hx   . Pancreatic cancer Neg Hx   . Liver cancer Neg Hx   . Esophageal cancer Neg Hx     Social History   Socioeconomic History  . Marital status: Single    Spouse name: Not on file  . Number of children: 0  . Years of education: Not on file  . Highest education level: Not on file  Occupational History  . 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 Smoking status: Never Smoker  . Smokeless tobacco: Never Used  Substance and Sexual  Activity  . Alcohol use: Not Currently  . Drug use: Not Currently  . Sexual activity: Not Currently  Other Topics Concern  . 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 Difficulty of Paying Living Expenses: Not hard at all  Food Insecurity: No Food Insecurity  . Worried About Charity fundraiser in the Last Year: Never true  . Ran Out of Food in the Last Year: Never true  Transportation Needs: No Transportation Needs  . Lack of Transportation (Medical): No  . Lack of Transportation (Non-Medical): No  Physical Activity: Sufficiently Active  . Days of Exercise per Week: 3 days  . Minutes of Exercise per Session: 60 min  Stress:   . Feeling of Stress :   Social Connections: Unknown  . Frequency of Communication with Friends and Family: More than three times a week  . Frequency of Social Gatherings with Friends and Family: Once a week  . Attends Religious Services: Never  . Active Member of Clubs or Organizations: Yes  . Attends Archivist Meetings: 1 to 4 times per year  . Marital Status: Not on file  Intimate Partner Violence:   . Fear of Current or Ex-Partner:   . Emotionally Abused:   Marland Kitchen Physically Abused:   . Sexually Abused:       Review of systems:  All other review of systems negative except as mentioned in the HPI.   Physical Exam: Vitals:   04/13/20 1559  BP: 120/60  Pulse: 84  Temp: 98.2 F (36.8 C)   Body mass index is 27.88 kg/m.  Data Reviewed:  Reviewed labs, radiology imaging, old records and pertinent past GI work up   Assessment and Plan/Recommendations:   PROCEDURE NOTE: The patient presents with symptomatic grade 2 hemorrhoids, requesting rubber band ligation of his/her hemorrhoidal  disease.  All risks, benefits and alternative forms of therapy were described and informed consent was obtained.  The anorectum was pre-medicated with [facility] Persistent fissure in right anterior position, the decision was made not to proceed with banding of internal hemorrhoids   The patient was discharged home without pain or other issues.  Dietary and behavioral recommendations were given and along with follow-up instructions.     The following adjunctive treatments were recommended:  Continue Benefiber 2-3 times daily with meals Use 0.125% nitroglycerin 3 times daily, small pea-sized amount for 6 to 8 weeks Okay to use Anusol suppository  per rectum at bedtime as needed  The patient will return in 2 to 3 months for  follow-up and possible hemorrhoidal banding as required. No complications were encountered and the patient tolerated the procedure well.  This visit required 20 minutes of patient care (this includes precharting, chart review, review of results, face-to-face time used for counseling as well as treatment plan and follow-up. The patient was provided an opportunity to ask questions and all were answered. The patient agreed with the plan and demonstrated an understanding of the instructions.  Damaris Hippo , MD    CC: Debbrah Alar, NP

## 2020-04-16 NOTE — Progress Notes (Signed)
CTA of head shows no cause for blurred vision in left eye.  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  It is very small but I would repeat an MRA of head in one year to monitor.  I would like her to get a formal eye exam for further evaluation of causes of migraine.  We can refer her if she doesn't have an eye doctor.

## 2020-04-23 ENCOUNTER — Telehealth: Payer: Self-pay | Admitting: Gastroenterology

## 2020-04-23 NOTE — Telephone Encounter (Signed)
Please advise patient to use preparation H suppository OTC instead given Prescription for hydrocortisone suppository is not covered by her insurance. Thanks

## 2020-04-24 MED ORDER — PHENYLEPHRINE IN HARD FAT 0.25 % RE SUPP
1.0000 | Freq: Every day | RECTAL | 0 refills | Status: DC
Start: 2020-04-24 — End: 2020-09-13

## 2020-04-24 NOTE — Telephone Encounter (Signed)
Patient aware that she can use preparation H suppository over-the-counter since her insurance will not cover prescription of hydrocortisone suppository.  Patient agreed to plan and verbalized understanding.  No further questions.

## 2020-05-28 ENCOUNTER — Emergency Department (HOSPITAL_COMMUNITY)
Admission: EM | Admit: 2020-05-28 | Discharge: 2020-05-29 | Discharge status: Against Medical Advice | Payer: Medicaid Other

## 2020-07-09 ENCOUNTER — Telehealth: Payer: Self-pay | Admitting: Medical

## 2020-07-09 ENCOUNTER — Ambulatory Visit: Payer: Medicaid Other | Admitting: Medical

## 2020-07-09 ENCOUNTER — Other Ambulatory Visit: Payer: Self-pay

## 2020-07-09 VITALS — BP 113/40 | HR 87 | Resp 18 | Ht 67.0 in | Wt 179.2 lb

## 2020-07-09 DIAGNOSIS — S81801A Unspecified open wound, right lower leg, initial encounter: Secondary | ICD-10-CM

## 2020-07-09 DIAGNOSIS — S81802A Unspecified open wound, left lower leg, initial encounter: Secondary | ICD-10-CM

## 2020-07-09 DIAGNOSIS — R350 Frequency of micturition: Secondary | ICD-10-CM | POA: Diagnosis not present

## 2020-07-09 DIAGNOSIS — L089 Local infection of the skin and subcutaneous tissue, unspecified: Secondary | ICD-10-CM

## 2020-07-09 DIAGNOSIS — R739 Hyperglycemia, unspecified: Secondary | ICD-10-CM | POA: Diagnosis not present

## 2020-07-09 MED ORDER — MUPIROCIN 2 % EX OINT: TOPICAL_OINTMENT | CUTANEOUS | 0 refills | Status: DC

## 2020-07-09 MED ORDER — CLINDAMYCIN HCL 150 MG PO CAPS
150.0000 mg | ORAL_CAPSULE | Freq: Three times a day (TID) | ORAL | 0 refills | Status: DC
Start: 2020-07-09 — End: 2020-09-13

## 2020-07-09 MED ORDER — HYDROXYZINE HCL 10 MG PO TABS
ORAL_TABLET | ORAL | 0 refills | Status: DC
Start: 2020-07-09 — End: 2024-02-25

## 2020-07-09 NOTE — Telephone Encounter (Signed)
poct ua collected but not ran. Will you check with lab or will you result. Not sure where pt urine is? I took out order so I could close chart. Placed new order outside of note. Will you get someone to result the ua. Pass message to lab if they have urine.

## 2020-07-09 NOTE — Telephone Encounter (Signed)
Opened to put in poct urine order.

## 2020-07-09 NOTE — Progress Notes (Signed)
Subjective:    Patient ID: Shannon Roy, female    DOB: Sep 10, 1986, 34 y.o.   MRN: [id number]  HPI  Pt in with shallow ulcers to bother medial calfs. She does have history of eczema. She was itching these area aggressively over past 2 months. Ulcers form and now they are slowly healing.    Pt has bought some compression stocking that she uses daily.  Pt has mild elevated sugar in past. Pt has no hx of diabetes.  Pt has had some yellow DC present usually in the morning. . Culture at [facility] in [place] was negative but I don't see those. Pt tried to pull up results on line but could not.. They did give  her clindamycin 300 mg qid x 7 days.   Pt states recent frequen urination today  but no associated uti symptoms.   Pt dry rash on back of both hands with rash. Had eczema since youth.    Review of Systems  Constitutional: Negative for chills, fatigue and fever.  Respiratory: Negative for chest tightness, shortness of breath and wheezing.   Cardiovascular: Negative for chest pain and palpitations.  Gastrointestinal: Negative for abdominal pain, constipation and nausea.  Endocrine: Positive for polyuria. Negative for polydipsia and polyphagia.  Genitourinary: Negative for decreased urine volume, difficulty urinating, dysuria, vaginal discharge and vaginal pain.  Musculoskeletal: Negative for back pain and myalgias.  Skin: Negative for rash.       See hpi.  Hematological: Negative for adenopathy. Does not bruise/bleed easily.  Psychiatric/Behavioral: Negative for suicidal ideas. The patient is not nervous/anxious.     Past Medical History:  Diagnosis Date  . Anal fissure   . Asthma   . Blood transfusion without reported diagnosis   . Dermoid cyst    iguinal area  . Eczema   . Migraines   . Murmur, cardiac   . Sickle cell anemia (HCC)   . Stroke [facility])      Social History   Socioeconomic History  . Marital status: Single    Spouse name: Not on file  . Number of children:  0  . Years of education: Not on file  . Highest education level: Not on file  Occupational History  . 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 Smoking status: Never Smoker  . Smokeless tobacco: Never Used  Vaping Use  . Vaping Use: Never used  Substance and Sexual Activity  . Alcohol use: Not Currently  . Drug use: Not Currently  . Sexual activity: Not Currently  Other Topics Concern  . 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 Difficulty of Paying Living Expenses: Not hard at all  Food Insecurity: No Food Insecurity  . Worried About Charity fundraiser in the Last Year: Never true  . Ran Out of Food in the Last Year: Never true  Transportation Needs: No Transportation Needs  . Lack of Transportation (Medical): No  . Lack of Transportation (Non-Medical): No  Physical Activity: Sufficiently Active  . Days of Exercise per Week: 3 days  . Minutes of Exercise per Session: 60 min  Stress:   . Feeling of Stress :   Social Connections: Unknown  .  Frequency of Communication with Friends and Family: More than three times a week  . Frequency of Social Gatherings with Friends and Family: Once a week  . Attends Religious Services: Never  . Active Member of Clubs or Organizations: Yes  . Attends Archivist Meetings: 1 to 4 times per year  . Marital Status: Not on file  Intimate Partner Violence:   . Fear of Current or Ex-Partner:   . Emotionally Abused:   Marland Kitchen Physically Abused:   . Sexually Abused:     Past Surgical History:  Procedure Laterality Date  . ANKLE SURGERY Right 2002   broken after a fall  . anklesurgery Right 2015   to have hardware removed  . CHOLECYSTECTOMY    . DERMOID CYST  EXCISION Bilateral    2018,  removed from inguinal area  . TONSILLECTOMY      Family History  Problem Relation Age of Onset  . Diabetes Mother   . Sickle cell trait Sister   . Sickle cell trait Sister   . Depression Sister   . Breast cancer Maternal Aunt   . Diabetes Maternal Aunt   . Breast cancer Maternal Aunt   . Diabetes Maternal Aunt   . Colon cancer Neg Hx   . Stomach cancer Neg Hx   . Pancreatic cancer Neg Hx   . Liver cancer Neg Hx   . Esophageal cancer Neg Hx     Allergies  Allergen Reactions  . Bee Venom Swelling  . Pollen Extract Other (See Comments)    Seasonal allergies     Current Outpatient Medications on File Prior to Visit  Medication Sig Dispense Refill  . albuterol (VENTOLIN HFA) 108 (90 Base) MCG/ACT inhaler Inhale 1-2 puffs into the lungs every 6 (six) hours as needed for wheezing or shortness of breath.    . AMBULATORY NON FORMULARY MEDICATION Medication Name: Use Nitroglycerin 0.125% pea sized amount three times a day for 6-8 weeks 30 g 1  . AMBULATORY NON FORMULARY MEDICATION Nitroglycerin ointment 0.125%  Use a pea sized amount rectally three times a day for 6-8 weeks 30 g 0  . clobetasol cream (TEMOVATE) 5.68 % Apply 1 application topically daily.    Marland Kitchen EPINEPHrine 0.3 mg/0.3 mL IJ SOAJ injection Inject 0.3 mg into the muscle daily as needed for anaphylaxis.    [name] (AIMOVIG) 140 MG/ML SOAJ Inject 140 mg into the skin every 28 (twenty-eight) days. 1 pen 11  . folic acid (FOLVITE) 1 MG tablet Take 1 mg by mouth daily.    . hydroxypropyl methylcellulose / hypromellose (ISOPTO TEARS / GONIOVISC) 2.5 % ophthalmic solution Place 1 drop into both eyes 3 (three) times daily as needed for dry eyes.    Marland Kitchen oxyCODONE-acetaminophen (PERCOCET/ROXICET) 5-325 MG tablet Take 1-2 tablets by mouth every 4 (four) hours as needed for severe pain.    . pantoprazole (PROTONIX) 40 MG tablet Take 40 mg by mouth daily.    . phenylephrine (,USE FOR PREPARATION-H,) 0.25 % suppository Place 1  suppository rectally at bedtime. 12 suppository 0  . triamcinolone cream (KENALOG) 0.1 % Apply 1 application topically 2 (two) times daily as needed (skin care).    . triamcinolone cream (KENALOG) 0.5 % Apply 1 application topically daily.     No current facility-administered medications on file prior to visit.    BP (!) 113/40 (BP Location: Left Arm, Patient Position: Sitting, Cuff Size: Large)   Pulse 87   Resp 18   Ht 5\' 7"  (  1.702 m)   Wt 179 lb 3.2 oz (81.3 kg)   SpO2 100%   BMI 28.07 kg/m       Objective:   Physical Exam   General- No acute distress. Pleasant patient. Neck- Full range of motion, no jvd Lungs- Clear, even and unlabored. Heart- regular rate and rhythm. Neurologic- CNII- XII grossly intact.  Skin- thick liquinfied rash on back of both hands and foreams. Lower ext- both medial distal calf area above ankles show 2.5 cm x 2.5 cm wide area of broken down skin in square shape. But appears that granulation tissues formed in both area. No obvious active dc presently.very shallow breakdown at this point/     Assessment & Plan:  Appear to have slow healing wounds to both lower ext over past 2 months. Appears over 2 weeks some improvement with use of clindamycin. We got wound culture today to see if bacteria grows out. Decided to extend clindamycin but give lower dose. Use probiotic rich foods as discussed. Can apply mupirocin to wound area once daily as well.  For elevated blood sugar will get cmp and a1c today.  Decided to go ahead and refer you to wound care as una boots may be helpful.  For hx of eczema I advised use moisturizer twice daily, dove moisturizing soap and gave hydroxyzine low dose for itching. Also went ahead and referred to dermatologist.  For frequent urination today ordered UA. Waiting on result of study to see if will get culture.  Follow up in  7-14 days or as needed. Hoping to get in with wound care.  Mackie Pai, PA-C   Time spent  with patient today was 40  minutes which consisted of chart review, discussing diagnoses, work up, treatment, answering question, placing 2 referrals and documentation.

## 2020-07-09 NOTE — Patient Instructions (Addendum)
Appear to have slow healing wounds to both lower ext over past 2 months. Appears over 2 weeks some improvement with use of clindamycin. We got wound culture today to see if bacteria grows out. Decided to extend clindamycin but give lower dose. Use probiotic rich foods as discussed. Can apply mupirocin to wound area once daily as well.  For elevated blood sugar will get cmp and a1c today.  Decided to go ahead and refer you to wound care as una boots may be helpful.  For hx of eczema I advised use moisturizer twice daily, dove moisturizing soap and gave hydroxyzine low dose for itching. Also went ahead and referred to dermatologist.  For frequent urination today ordered UA. Waiting on result of study to see if will get culture.  Follow up in  7-14 days or as needed. Hoping to get in with wound care.

## 2020-07-10 ENCOUNTER — Other Ambulatory Visit: Payer: Medicaid Other

## 2020-07-10 ENCOUNTER — Telehealth: Payer: Self-pay | Admitting: *Deleted

## 2020-07-10 DIAGNOSIS — R350 Frequency of micturition: Secondary | ICD-10-CM

## 2020-07-10 DIAGNOSIS — R739 Hyperglycemia, unspecified: Secondary | ICD-10-CM

## 2020-07-10 LAB — CBC WITH DIFFERENTIAL/PLATELET
Basophils Absolute: 0.1 10*3/uL (ref 0.0–0.1)
Basophils Relative: 0.8 % (ref 0.0–3.0)
Eosinophils Absolute: 2.4 10*3/uL — ABNORMAL HIGH (ref 0.0–0.7)
Eosinophils Relative: 16.8 % — ABNORMAL HIGH (ref 0.0–5.0)
HCT: 17.6 % — CL (ref 36.0–46.0)
Hemoglobin: 6.2 g/dL — CL (ref 12.0–15.0)
Lymphocytes Relative: 26.1 % (ref 12.0–46.0)
Lymphs Abs: 3.8 10*3/uL (ref 0.7–4.0)
MCHC: 35 g/dL (ref 30.0–36.0)
MCV: 97.5 fl (ref 78.0–100.0)
Monocytes Absolute: 1.1 10*3/uL — ABNORMAL HIGH (ref 0.1–1.0)
Monocytes Relative: 7.4 % (ref 3.0–12.0)
Neutro Abs: 7.1 10*3/uL (ref 1.4–7.7)
Neutrophils Relative %: 48.9 % (ref 43.0–77.0)
Platelets: 511 10*3/uL — ABNORMAL HIGH (ref 150.0–400.0)
RBC: 1.81 Mil/uL — ABNORMAL LOW (ref 3.87–5.11)
RDW: 25.7 % — ABNORMAL HIGH (ref 11.5–15.5)
WBC: 14.5 10*3/uL — ABNORMAL HIGH (ref 4.0–10.5)

## 2020-07-10 NOTE — Telephone Encounter (Signed)
Called pt twice went straight to voicemail and mailbox is full.

## 2020-07-10 NOTE — Telephone Encounter (Signed)
Attempted call but no answer/no way to leave message on phone. Then sent my chart message.

## 2020-07-10 NOTE — Telephone Encounter (Signed)
CRITICAL VALUE STICKER  CRITICAL VALUE:  Hgb -- 6.2,  HCT -- 17.6  RECEIVER (on-site recipient of call):  Kelle Darting, [place] NOTIFIED:  07/10/20 @ 10:41am  MESSENGER (representative from lab):  Santiago Glad  MD NOTIFIED:  Mackie Pai, PA-C  TIME OF NOTIFICATION:  10:43  RESPONSE:

## 2020-07-10 NOTE — Telephone Encounter (Signed)
Lab didn't collect urine only blood work and wound culture

## 2020-07-10 NOTE — Telephone Encounter (Signed)
She only described to me frequency of urination briefly. I think just one day. See if still case today. If so then ask you arrange for her to come by and give sample. If states not present today then not necessary.

## 2020-07-11 ENCOUNTER — Institutional Professional Consult (permissible substitution): Payer: Medicaid Other | Admitting: Plastic Surgery

## 2020-07-12 LAB — WOUND CULTURE
GRAM STAIN:: NONE SEEN
MICRO NUMBER:: 10776028
SPECIMEN QUALITY:: ADEQUATE

## 2020-07-13 LAB — HEMOGLOBIN A1C: Hgb A1c MFr Bld: <3.4 % of total Hgb (ref <5.7)

## 2020-07-17 ENCOUNTER — Encounter: Payer: Self-pay | Admitting: Internal Medicine

## 2020-07-17 ENCOUNTER — Ambulatory Visit (INDEPENDENT_AMBULATORY_CARE_PROVIDER_SITE_OTHER): Payer: Medicaid Other | Admitting: Internal Medicine

## 2020-07-17 ENCOUNTER — Other Ambulatory Visit: Payer: Self-pay

## 2020-07-17 VITALS — BP 102/72 | HR 95 | Temp 98.3°F | Ht 67.0 in | Wt 179.4 lb

## 2020-07-17 DIAGNOSIS — L97929 Non-pressure chronic ulcer of unspecified part of left lower leg with unspecified severity: Secondary | ICD-10-CM | POA: Diagnosis not present

## 2020-07-17 DIAGNOSIS — L97919 Non-pressure chronic ulcer of unspecified part of right lower leg with unspecified severity: Secondary | ICD-10-CM | POA: Diagnosis not present

## 2020-07-17 MED ORDER — LIDOCAINE 5 % EX OINT
1.0000 | TOPICAL_OINTMENT | CUTANEOUS | 0 refills | Status: DC | PRN
Start: 2020-07-17 — End: 2021-03-29

## 2020-07-17 NOTE — Patient Instructions (Signed)
Mariza Bourget It was a pleasure seeing you today.  Please follow the instructions below for your wound care  1) vaseline dressing changes daily 2) followed by non stick pad 3) wrap with gauze  ABIs have been ordered, someone will call you to schedule your appointment  Please follow up with me in 2 weeks.  Call us at [phone] with any questions or concerns

## 2020-07-17 NOTE — Progress Notes (Signed)
   Subjective:     Patient ID: Shannon Roy, female    DOB: 03/07/1986, 34 y.o.   MRN: [id number]  Chief Complaint  Patient presents with  .  Bilateral leg ulcers    HPI: The patient is a 34 y.o. female with history of sickle cell disease that presents to the clinic today for bilateral lower extremity ulcers.  Patient reports a 40-month history of bilateral leg wounds.  She states this started as eczema and have developed into open wounds.  She initially tried alcohol and hydrogen peroxide on the wounds.  She has also been prescribed clindamycin for possible infection.  She is currently using Bactroban on the wounds and keeping them covered.  She reports yellow discharge from the wound sites in the morning time and this dries in the day.  She denies fever/chills, increased warmth or erythema to the wounds.   Review of Systems  All other systems reviewed and are negative.    has a past medical history of Anal fissure, Asthma, Blood transfusion without reported diagnosis, Dermoid cyst, Eczema, Migraines, Murmur, cardiac, Sickle cell anemia ([place]), and Stroke ([place]).  has a past surgical history that includes Cholecystectomy; Tonsillectomy; Dermoid cyst  excision (Bilateral); Ankle surgery (Right, 2002); and anklesurgery (Right, 2015).  reports that she has never smoked. She has never used smokeless tobacco. Objective:   Vital Signs BP 102/72 (BP Location: Left Arm, Patient Position: Sitting, Cuff Size: Large)   Pulse 95   Temp 98.3 F (36.8 C) (Oral)   Ht 5\' 7"  (1.702 m)   Wt 179 lb 6.4 oz (81.4 kg)   LMP 07/13/2020 (Exact Date)   SpO2 99%   BMI 28.10 kg/m  Vital Signs and Nursing Note Reviewed Physical Exam Skin:    Comments: Ulcerations on the medial aspect of of the lower extremities bilateral near the ankles Granulation tissue present with epithelialization Scattered open lesions within the wound bed with minimal yellow drainage              Assessment/Plan:      ICD-10-CM   1. Ulcers of both lower legs (HCC)  L97.919 VAS [place] ABI WITH/WO TBI   L97.929    Assessment: Chronic bilateral lower extremity ulcers  Patient's wound do not appear acutely infected.  I recommended she continue using the antibiotic ointment for now and keep the wound covered.  I will obtain ABIs.  The ulcers look like venous ulcers.  She will likely benefit from compression therapy if ABIs are normal. Follow up in 2 weeks.     Plan -ABIs -Bactroban and keep wounds covered  Boyd Kerbs, DO 07/17/2020, 5:01 PM

## 2020-07-18 ENCOUNTER — Ambulatory Visit (HOSPITAL_COMMUNITY)
Admission: RE | Admit: 2020-07-18 | Discharge: 2020-07-18 | Disposition: A | Discharge status: Home / Self Care | Payer: Medicaid Other | Source: Ambulatory Visit | Attending: Internal Medicine | Admitting: Internal Medicine

## 2020-07-18 DIAGNOSIS — L97929 Non-pressure chronic ulcer of unspecified part of left lower leg with unspecified severity: Secondary | ICD-10-CM

## 2020-07-18 DIAGNOSIS — L97919 Non-pressure chronic ulcer of unspecified part of right lower leg with unspecified severity: Secondary | ICD-10-CM

## 2020-07-23 ENCOUNTER — Ambulatory Visit: Payer: Medicaid Other | Admitting: Family

## 2020-07-26 ENCOUNTER — Encounter: Payer: Self-pay | Admitting: Medical

## 2020-07-27 NOTE — Telephone Encounter (Signed)
I dont see a Derm referral , I only see plastics .

## 2020-07-30 ENCOUNTER — Ambulatory Visit (INDEPENDENT_AMBULATORY_CARE_PROVIDER_SITE_OTHER): Payer: Medicaid Other | Admitting: Internal Medicine

## 2020-07-30 ENCOUNTER — Telehealth: Payer: Self-pay | Admitting: Medical

## 2020-07-30 ENCOUNTER — Other Ambulatory Visit: Payer: Self-pay

## 2020-07-30 ENCOUNTER — Encounter: Payer: Self-pay | Admitting: Internal Medicine

## 2020-07-30 VITALS — BP 105/61 | HR 89 | Temp 98.5°F

## 2020-07-30 DIAGNOSIS — L97929 Non-pressure chronic ulcer of unspecified part of left lower leg with unspecified severity: Secondary | ICD-10-CM

## 2020-07-30 DIAGNOSIS — L97919 Non-pressure chronic ulcer of unspecified part of right lower leg with unspecified severity: Secondary | ICD-10-CM

## 2020-07-30 NOTE — Telephone Encounter (Signed)
Opened to review 

## 2020-07-30 NOTE — Progress Notes (Signed)
° °  Subjective:     Patient ID: Shannon Roy, female    DOB: 1986-10-04, 34 y.o.   MRN: [id number]  Chief Complaint  Patient presents with   Follow-up bilateral leg ulcers    HPI: The patient is a 34 y.o. female with history of sickle cell disease that presents to the clinic today for bilateral lower extremity ulcers.  Patient states she has been using Vaseline on the leg ulcers covered with a pad and wrapped with gauze daily.  She continues to have yellow drainage but not purulent.  She reports improvement in pain to her lower extremities and is only taking Advil as needed.  She denies fever/chills, increased warmth or erythema to the wounds.  She also reports a history of a scaly itchy rash on the dorsal aspect of her hands bilaterally and this has been chronic.  She also reports chronic raised bumps on her abdomen that her itchy as well.   Review of Systems  All other systems reviewed and are negative.    has a past medical history of Anal fissure, Asthma, Blood transfusion without reported diagnosis, Dermoid cyst, Eczema, Migraines, Murmur, cardiac, Sickle cell anemia ([place]), and Stroke ([place]).  has a past surgical history that includes Cholecystectomy; Tonsillectomy; Dermoid cyst  excision (Bilateral); Ankle surgery (Right, 2002); and anklesurgery (Right, 2015).  reports that she has never smoked. She has never used smokeless tobacco. Objective:   Vital Signs BP 105/61 (BP Location: Left Arm, Patient Position: Sitting, Cuff Size: Large)    Pulse 89    Temp 98.5 F (36.9 C) (Oral)    LMP 07/13/2020 (Exact Date)    SpO2 99%  Vital Signs and Nursing Note Reviewed Physical Exam Skin:    Comments: Ulcerations on the medial aspect of of the lower extremities bilateral near the ankles Granulation tissue present with epithelialization Scattered open lesions within the wound bed with minimal yellow drainage  Scaly rash on hands bilaterally Raised bumps on abdomen          Assessment/Plan:     ICD-10-CM   1. Ulcers of both lower legs ([place])  L97.919    L97.929     Assessment: Chronic bilateral lower extremity ulcers  Wounds look stable today without signs of infection.  ABIs showed right 1.07 and left 1.11 resting ankle brachial index.  This is an within normal range. Results discussed with the patient. I recommended she try compression stockings to see if this helps with swelling and wound healing.  She can keep the wounds covered with a nonstick pad and some Vaseline. She does have itching around the edges and I recommended some hydrocortisone cream.  She does have a scaly rash on her hands.   I think she would benefit from a dermatology consult.  It appears she is having a flareup of an underlying disease.  Plan -Keep wounds covered with nonstick pad and wrap with rolled gauze -Try compression stockings -Dermatology referral   Boyd Kerbs, DO 07/30/2020, 9:24 AM

## 2020-08-01 ENCOUNTER — Ambulatory Visit: Payer: Medicaid Other | Admitting: Neurology

## 2020-08-03 ENCOUNTER — Ambulatory Visit: Payer: Medicaid Other | Admitting: Family

## 2020-08-13 DIAGNOSIS — D571 Sickle-cell disease without crisis: Secondary | ICD-10-CM | POA: Insufficient documentation

## 2020-08-13 DIAGNOSIS — I872 Venous insufficiency (chronic) (peripheral): Secondary | ICD-10-CM | POA: Insufficient documentation

## 2020-08-14 ENCOUNTER — Encounter: Payer: Self-pay | Admitting: Neurology

## 2020-08-14 NOTE — Progress Notes (Addendum)
Shannon Roy (Key: [id number]) Rx #: [id number] Aimovig 140MG /ML auto-injectors   Form [employer] [facility] Electronic [place] Form [phone] NCPDP) Created 18 hours ago Sent to Plan 4 minutes ago Plan Response 4 minutes ago Submit Clinical Questions 2 minutes ago Determination Favorable 2 minutes ago Message from Plan PA Case: [id number], Status: Approved, Coverage Starts on: 08/14/2020 12:00:00 AM, Coverage Ends on: 08/14/2021 12:00:00 AM.

## 2020-08-16 DIAGNOSIS — H01114 Allergic dermatitis of left upper eyelid: Secondary | ICD-10-CM | POA: Insufficient documentation

## 2020-08-20 ENCOUNTER — Ambulatory Visit: Payer: Medicaid Other | Admitting: Internal Medicine

## 2020-08-27 ENCOUNTER — Ambulatory Visit: Payer: Medicaid Other | Admitting: Internal Medicine

## 2020-09-13 ENCOUNTER — Ambulatory Visit (INDEPENDENT_AMBULATORY_CARE_PROVIDER_SITE_OTHER): Payer: Medicaid Other | Admitting: Vascular Surgery

## 2020-09-13 ENCOUNTER — Other Ambulatory Visit (HOSPITAL_COMMUNITY): Payer: Self-pay | Admitting: Vascular Surgery

## 2020-09-13 ENCOUNTER — Other Ambulatory Visit: Payer: Self-pay

## 2020-09-13 ENCOUNTER — Encounter: Payer: Self-pay | Admitting: Vascular Surgery

## 2020-09-13 ENCOUNTER — Ambulatory Visit (HOSPITAL_COMMUNITY)
Admission: RE | Admit: 2020-09-13 | Discharge: 2020-09-13 | Disposition: A | Discharge status: Home / Self Care | Payer: Medicaid Other | Source: Ambulatory Visit | Attending: Vascular Surgery | Admitting: Vascular Surgery

## 2020-09-13 VITALS — BP 114/74 | HR 85 | Temp 98.7°F | Resp 20 | Ht 67.0 in | Wt 180.0 lb

## 2020-09-13 DIAGNOSIS — M79604 Pain in right leg: Secondary | ICD-10-CM | POA: Diagnosis not present

## 2020-09-13 DIAGNOSIS — M79605 Pain in left leg: Secondary | ICD-10-CM

## 2020-09-13 DIAGNOSIS — L97919 Non-pressure chronic ulcer of unspecified part of right lower leg with unspecified severity: Secondary | ICD-10-CM | POA: Diagnosis not present

## 2020-09-13 DIAGNOSIS — L97929 Non-pressure chronic ulcer of unspecified part of left lower leg with unspecified severity: Secondary | ICD-10-CM

## 2020-09-13 NOTE — Progress Notes (Signed)
Referring Physician: Dr Heber Sweetwater  Patient name: Shannon Roy MRN: [id number] DOB: 12-08-1986 Sex: female  REASON FOR CONSULT: Bilateral leg ulceration  HPI: Shannon Roy is a 34 y.o. female, has medical history significant for sickle cell anemia.  She developed swelling in both lower extremities and itching around her ankles about 5 months ago.  She then developed ulceration over the medial malleolus bilaterally.  These did heal spontaneously but after about 4 months with local wound care and antibiotics.  She has not really worn compression therapy recently and was told by her doctor not to until she had further vascular evaluation.  She has no prior history of DVT.  She has no family history of varicose veins.  She has never been pregnant previously.    Past Medical History:  Diagnosis Date  . Anal fissure   . Asthma   . Blood transfusion without reported diagnosis   . Dermoid cyst    iguinal area  . Eczema   . Migraines   . Murmur, cardiac   . Sickle cell anemia (HCC)   . Stroke [facility])    Past Surgical History:  Procedure Laterality Date  . ANKLE SURGERY Right 2002   broken after a fall  . anklesurgery Right 2015   to have hardware removed  . CHOLECYSTECTOMY    . DERMOID CYST  EXCISION Bilateral    2018, removed from inguinal area  . TONSILLECTOMY      Family History  Problem Relation Age of Onset  . Diabetes Mother   . Sickle cell trait Sister   . Sickle cell trait Sister   . Depression Sister   . Breast cancer Maternal Aunt   . Diabetes Maternal Aunt   . Breast cancer Maternal Aunt   . Diabetes Maternal Aunt   . Colon cancer Neg Hx   . Stomach cancer Neg Hx   . Pancreatic cancer Neg Hx   . Liver cancer Neg Hx   . Esophageal cancer Neg Hx     SOCIAL HISTORY: Social History   Socioeconomic History  . Marital status: Single    Spouse name: Not on file  . Number of children: 0  . Years of education: Not on file  . Highest education level: Not on file    Occupational History  . 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 Smoking status: Never Smoker  . Smokeless tobacco: Never Used  Vaping Use  . Vaping Use: Never used  Substance and Sexual Activity  . Alcohol use: Not Currently  . Drug use: Not Currently  . Sexual activity: Not Currently  Other Topics Concern  . 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 Difficulty of Paying Living Expenses: Not hard at all  Food Insecurity: No Food Insecurity  . Worried About Charity fundraiser in the Last Year: Never true  . Ran Out of Food in the Last Year: Never true  Transportation Needs: No Transportation Needs  . Lack of Transportation (Medical): No  . Lack of Transportation (Non-Medical): No  Physical Activity: Sufficiently Active  . Days of Exercise per Week: 3 days  . Minutes of Exercise per Session: 60 min  Stress:   . Feeling of Stress : Not on file  Social Connections: Unknown  . Frequency of Communication with Friends and Family: More than three times a week  . Frequency of Social Gatherings with Friends and Family: Once a week  . Attends Religious Services: Never  . Active Member of Clubs or Organizations: Yes  . Attends Archivist Meetings: 1 to 4 times per year  . Marital Status: Not on file  Intimate Partner Violence:   . Fear of Current or Ex-Partner: Not on file  . Emotionally Abused: Not on file  . Physically Abused: Not on file  . Sexually Abused: Not on file    Allergies  Allergen Reactions  . Bee Venom Swelling  . Pollen Extract Other (See Comments)    Seasonal allergies     Current Outpatient Medications  Medication Sig Dispense Refill  . albuterol (VENTOLIN HFA) 108 (90 Base) MCG/ACT inhaler Inhale  1-2 puffs into the lungs every 6 (six) hours as needed for wheezing or shortness of breath.    . AMBULATORY NON FORMULARY MEDICATION Medication Name: Use Nitroglycerin 0.125% pea sized amount three times a day for 6-8 weeks 30 g 1  . AMBULATORY NON FORMULARY MEDICATION Nitroglycerin ointment 0.125%  Use a pea sized amount rectally three times a day for 6-8 weeks 30 g 0  . EPINEPHrine 0.3 mg/0.3 mL IJ SOAJ injection Inject 0.3 mg into the muscle daily as needed for anaphylaxis.    [name] (AIMOVIG) 140 MG/ML SOAJ Inject 140 mg into the skin every 28 (twenty-eight) days. 1 pen 11  . folic acid (FOLVITE) 1 MG tablet Take 1 mg by mouth daily.    . hydroxypropyl methylcellulose / hypromellose (ISOPTO TEARS / GONIOVISC) 2.5 % ophthalmic solution Place 1 drop into both eyes 3 (three) times daily as needed for dry eyes.    . hydrOXYzine (ATARAX/VISTARIL) 10 MG tablet 1-2 tab po q 8 hrs prn itching 30 tablet 0  . lidocaine (XYLOCAINE) 5 % ointment Apply 1 application topically as needed. 35.44 g 0  . oxyCODONE-acetaminophen (PERCOCET/ROXICET) 5-325 MG tablet Take 1-2 tablets by mouth every 4 (four) hours as needed for severe pain.     . pantoprazole (PROTONIX) 40 MG tablet Take 40 mg by mouth daily.    Marland Kitchen triamcinolone cream (KENALOG) 0.5 % Apply 1 application topically daily.    . clobetasol cream (TEMOVATE) 3.89 % Apply 1 application topically daily. (Patient not taking: Reported on 07/30/2020)    . mupirocin ointment (BACTROBAN) 2 % Apply to area one time daily. (Patient not taking: Reported on 07/30/2020) 22 g 0   No current facility-administered medications for this visit.    ROS:   General:  No weight loss, Fever, chills  HEENT: No recent headaches, no nasal bleeding, no visual changes, no sore throat  Neurologic: No dizziness, blackouts, seizures. No recent symptoms of stroke or mini- stroke. No recent episodes of slurred speech, or temporary blindness.  Cardiac: No recent episodes of  chest pain/pressure, no shortness of breath at rest.  No shortness of breath with exertion.  Denies history of atrial fibrillation or irregular heartbeat  Vascular: No history of rest pain in feet.  No history of claudication.  No history of non-healing ulcer, No history of DVT   Pulmonary: No home oxygen, no productive cough, no hemoptysis,  No asthma or wheezing  Musculoskeletal:  [ ]  Arthritis, [ ]  Low back pain,  [ ]  Joint pain  Hematologic:No history of hypercoagulable  state.  No history of easy bleeding.  + history of anemia  Gastrointestinal: No hematochezia or melena,  No gastroesophageal reflux, no trouble swallowing  Urinary: [ ]  chronic Kidney disease, [ ]  on HD - [ ]  MWF or [ ]  TTHS, [ ]  Burning with urination, [ ]  Frequent urination, [ ]  Difficulty urinating;   Skin: No rashes  Psychological: No history of anxiety,  No history of depression   Physical Examination  Vitals:   09/13/20 1420  BP: 114/74  Pulse: 85  Resp: 20  Temp: 98.7 F (37.1 C)  SpO2: 97%  Weight: 180 lb (81.6 kg)  Height: 5\' 7"  (1.702 m)    Body mass index is 28.19 kg/m.  General:  Alert and oriented, no acute distress HEENT: Normal Neck: No JVD Cardiac: Regular Rate and Rhythm Skin: No rash, brawny discoloration with hypopigmentation over a surface area about 7 cm over the medial malleolus bilaterally Extremity Pulses:  2+ radial, brachial, femoral, absent right 1+ left dorsalis pedis, 2+ posterior tibial pulses bilaterally Musculoskeletal: No deformity or edema  Neurologic: Upper and lower extremity motor 5/5 and symmetric  DATA:  Patient had bilateral lower extremity venous duplex study for reflux today.  I reviewed and interpreted the study.  This did show some reflux in the left saphenofemoral junction and left greater saphenous at the calf level there is also some venous reflux in the right greater saphenous in the calf level on the right side.  However vein diameter overall was fairly  small.  Saphenous was about 3 mm in diameter over most of its course.  There was no deep vein reflux.  ASSESSMENT: Recent episode of ulceration bilateral lower extremities.  Probably some of a component of this was related to venous reflux in her saphenous vein but vein is not dilated enough to consider laser ablation.  Patients with sickle cell anemia are also noted to have incident ulceration at times due to microvascular AV shunting of blood during crisis situations.  This may have happened in Ms. Yom.   PLAN: Patient was counseled today and protecting the skin of both lower extremities.  I do believe she would benefit from wearing lower extremity compression stockings to reduce swelling and prevent recurrent ulceration events.  This should not really change her overall arterial circulation as she has normal extremity pulses.  She will follow up with me on an as-needed basis if she develops recurrent ulceration or lower extremity swelling.  She will also follow-up if she develops more prominent varicosities in the future.   Ruta Hinds, MD [facility] Office: [phone]

## 2020-11-17 DIAGNOSIS — L209 Atopic dermatitis, unspecified: Secondary | ICD-10-CM | POA: Insufficient documentation

## 2020-11-23 NOTE — Progress Notes (Signed)
NEUROLOGY FOLLOW UP OFFICE NOTE  Shannon Roy [id number]   Subjective:  Shannon Roy is a 34 year oldleft-handed female with sickle cell anemia with history of stroke, asthma and migraines who follows up for migraines and history of stroke.    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  Aimovig was increased to 140mg  in April.  Migraines had improved.  On average she was averaging 2 mild headaches a month, aborted within an hour with 2 Aleve.  Over past 2 weeks, she has had increased headaches.  She has had 4 in past 2 weeks.  Severe pounding headache with blurred vision and preceded by dizziness.  They have been lasting a couple of hours.  She did try Ubrelvy but cannot remember if effective.    She has gained weight lately.  Diet hasn't changed but not keep track of what she eats.  No fast food.  She eats cheese, bread, meat.  Sleep is poor but has been even worse lately.  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) Rescue:  Switches between [employer] (about 2 days a week) Current NSAIDS:Aleve; ASA 81mg  Current analgesics:Percocet; BC powder Current triptans:none Current ergotamine:none Current anti-emetic:none Current muscle relaxants:none Current anti-anxiolytic:none Current sleep aide:none Current Antihypertensive medications:none Current Antidepressant medications:none Current Anticonvulsant medications:none Current anti-CGRP:Aimovig 140mg  Current Vitamins/Herbal/Supplements:none Current Antihistamines/Decongestants:none Other therapy:none Hormone/birth control:none  Caffeine:1 cup of coffee 10 days a month; 1 cup of tea 10 days a month; drinks a Coke 2 days a  month Diet:6 bottles of water daily Exercise:Not routine Depression:Moderate depression; Anxiety:yes Other pain:Sickle cell crisis infrequently Sleep hygiene:Poor. Difficulty falling asleep and then staying asleep.  HISTORY: She started having a persistent headache since age 42. She has had multiple MRIs in the past. Headaches vary (may be unilateral either side, frontal or back of neck. It is pounding. They are typically at 2-3/10 but they may become 8-10/10 usually lasting 1 to 4 hours and occur once a month. Typical associated symptoms include burning on the head, photophobia, phonophobia, nausea, osmophobia. No associated vomiting, visual disturbance or unilateral numbness or weakness. No identifiable trigger. Vomiting and resting in dark room helps relieve. She went to the ED on 09/07/2019 because she had a severe left sided migraine associated with complete vision loss of left eye for 20-30 minutes followed by blurred vision in the left eye and tingling around the eye. Blurred vision lasted 8 hours. MRI of brain with and without contrast from 09/07/2019 was personally reviewed and was normal.  She sometimes has positional vertigo.  Other history: She reports that in 6th grade, she began having syncopal spellsin which she vision blacked out and then lost consciousness. At the time, she was told they were "mini strokes". Episodes resolved after about 1 year.  Rescue therapy: Aleve and BC for mild to moderate headache; percocet for severe headache. Frequency of analgesics: Aleve and BC daily.   Past NSAIDS:Ibuprofen, naproxen, flurbiprofen Past analgesics:Fioricet; oxycodone Past abortive triptans:Maxalt 10mg ; sumatriptan 50mg  Past abortive ergotamine:none Past muscle relaxants:Robaxin Past anti-emetic:Reglan Past antihypertensive medications:none Past antidepressant medications:Amitriptyline(ineffective, drowsiness) Past anticonvulsant  medications:Topiramate (ineffective) Past anti-CGRP:Nurtec Past vitamins/Herbal/Supplements:none Past antihistamines/decongestants:none Other past therapies:none   Family history of headache:Mom (migraines), 2 sisters (migraines), 3 maternal aunts (migraines)  PAST MEDICAL HISTORY: Past  Medical History:  Diagnosis Date  . Anal fissure   . Asthma   . Blood transfusion without reported diagnosis   . Dermoid cyst    iguinal area  . Eczema   . Migraines   . Murmur, cardiac   . Sickle cell anemia (HCC)   . Stroke [facility])     MEDICATIONS: Current Outpatient Medications on File Prior to Visit  Medication Sig Dispense Refill  . albuterol (VENTOLIN HFA) 108 (90 Base) MCG/ACT inhaler Inhale 1-2 puffs into the lungs every 6 (six) hours as needed for wheezing or shortness of breath.    . AMBULATORY NON FORMULARY MEDICATION Medication Name: Use Nitroglycerin 0.125% pea sized amount three times a day for 6-8 weeks 30 g 1  . AMBULATORY NON FORMULARY MEDICATION Nitroglycerin ointment 0.125%  Use a pea sized amount rectally three times a day for 6-8 weeks 30 g 0  . clobetasol cream (TEMOVATE) 8.46 % Apply 1 application topically daily. (Patient not taking: Reported on 07/30/2020)    . EPINEPHrine 0.3 mg/0.3 mL IJ SOAJ injection Inject 0.3 mg into the muscle daily as needed for anaphylaxis.    [name] (AIMOVIG) 140 MG/ML SOAJ Inject 140 mg into the skin every 28 (twenty-eight) days. 1 pen 11  . folic acid (FOLVITE) 1 MG tablet Take 1 mg by mouth daily.    . hydroxypropyl methylcellulose / hypromellose (ISOPTO TEARS / GONIOVISC) 2.5 % ophthalmic solution Place 1 drop into both eyes 3 (three) times daily as needed for dry eyes.    . hydrOXYzine (ATARAX/VISTARIL) 10 MG tablet 1-2 tab po q 8 hrs prn itching 30 tablet 0  . lidocaine (XYLOCAINE) 5 % ointment Apply 1 application topically as needed. 35.44 g 0  . mupirocin ointment (BACTROBAN) 2 % Apply to area one time daily. (Patient  not taking: Reported on 07/30/2020) 22 g 0  . oxyCODONE-acetaminophen (PERCOCET/ROXICET) 5-325 MG tablet Take 1-2 tablets by mouth every 4 (four) hours as needed for severe pain.     . pantoprazole (PROTONIX) 40 MG tablet Take 40 mg by mouth daily.    Marland Kitchen triamcinolone cream (KENALOG) 0.5 % Apply 1 application topically daily.     No current facility-administered medications on file prior to visit.    ALLERGIES: Allergies  Allergen Reactions  . Bee Venom Swelling  . Pollen Extract Other (See Comments)    Seasonal allergies     FAMILY HISTORY: Family History  Problem Relation Age of Onset  . Diabetes Mother   . Sickle cell trait Sister   . Sickle cell trait Sister   . Depression Sister   . Breast cancer Maternal Aunt   . Diabetes Maternal Aunt   . Breast cancer Maternal Aunt   . Diabetes Maternal Aunt   . Colon cancer Neg Hx   . Stomach cancer Neg Hx   . Pancreatic cancer Neg Hx   . Liver cancer Neg Hx   . Esophageal cancer Neg Hx     SOCIAL HISTORY: Social History   Socioeconomic History  . Marital status: Single    Spouse name: Not on file  . Number of children: 0  . Years of education: Not on file  . Highest education level: Not on file  Occupational History  . 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 Smoking status: Never Smoker  . Smokeless tobacco: Never Used  Vaping Use  . Vaping Use: Never used  Substance and Sexual Activity  . Alcohol use: Not Currently  . Drug use:  Not Currently  . Sexual activity: Not Currently  Other Topics Concern  . Not on file  Social History Narrative   No children   [employer]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   Lives with her youngest sister   Cat and a dog   Enjoys hiking, photography, restaurants   Left handed   Two story home   Social Determinants of Health   Financial Resource Strain: Not on file  Food Insecurity: Not on file  Transportation Needs: Not on file  Physical Activity: Not on file  Stress: Not on file  Social  Connections: Not on file  Intimate Partner Violence: Not on file     Objective:  Blood pressure 122/82, pulse 90, height 5\' 7"  (1.702 m), weight 189 lb 6.4 oz (85.9 kg), SpO2 97 %. General: No acute distress.  Patient appears well-groomed.   Head:  Normocephalic/atraumatic Eyes:  Fundi examined but not visualized Neck: supple, no paraspinal tenderness, full range of motion Heart:  Regular rate and rhythm Lungs:  Clear to auscultation bilaterally Back: No paraspinal tenderness Neurological Exam: alert and oriented to person, place, and time. Attention span and concentration intact, recent and remote memory intact, fund of knowledge intact.  Speech fluent and not dysarthric, language intact.  CN II-XII intact. Bulk and tone normal, muscle strength 5/5 throughout.  Sensation to light touch, temperature and vibration intact.  Deep tendon reflexes 2+ throughout, toes downgoing.  Finger to nose and heel to shin testing intact.  Gait normal, Romberg negative.   Assessment/Plan:   1.  migraine without aura, without status migrainosus, not intractable, improved on Aimovig but increased frequency over past couple of weeks, possibly stress-induced. 2.  Left amaurosis fugax.  Migraine aura vs central retinal artery occlusion 3.  Sickle cell anemia  1.  At this time, she will work on lifestyle modification to improve migraine frequency.  If ineffective, we can add a secondary preventative. 2.  Aimovig 140mg  every 28 days 3.  For migraine rescue, she will again try the Ubrelvy 100mg  and contact me - if effective, will send prescription. 4.  Limit use of pain relievers to no more than 2 days out of week to prevent risk of rebound or medication-overuse headache. 5.  Keep headache diary 6.  Consider supplements:  magnesium citrate 400mg  daily, riboflavin 400mg  daily, coenzyme Q10 100mg  three times daily. 7.  Follow up in 4 to 6 months.  Metta Clines, DO  CC: Debbrah Alar, NP

## 2020-11-26 ENCOUNTER — Ambulatory Visit (INDEPENDENT_AMBULATORY_CARE_PROVIDER_SITE_OTHER): Payer: Medicaid Other | Admitting: Neurology

## 2020-11-26 ENCOUNTER — Ambulatory Visit: Payer: Medicaid Other | Admitting: Family

## 2020-11-26 ENCOUNTER — Encounter: Payer: Self-pay | Admitting: Neurology

## 2020-11-26 ENCOUNTER — Other Ambulatory Visit: Payer: Self-pay

## 2020-11-26 VITALS — BP 122/82 | HR 90 | Ht 67.0 in | Wt 189.4 lb

## 2020-11-26 DIAGNOSIS — G453 Amaurosis fugax: Secondary | ICD-10-CM | POA: Diagnosis not present

## 2020-11-26 DIAGNOSIS — G43009 Migraine without aura, not intractable, without status migrainosus: Secondary | ICD-10-CM | POA: Diagnosis not present

## 2020-11-26 NOTE — Patient Instructions (Signed)
  1. Continue Aimovig 140mg  every 28 days.  If no improvement in 4 to 6 weeks and we need to add a second preventative contact me. 2. Take [name] 100mg  at earliest onset of headache.  May repeat dose once in 2 hours if needed.  Maximum 2 tablets in 24 hours.  If effective, contact me for prescription.  Otherwise, may use Aleve 3. Limit use of pain relievers to no more than 2 days out of the week.  These medications include acetaminophen, NSAIDs (ibuprofen/Advil/Motrin, naproxen/Aleve, triptans (Imitrex/sumatriptan), Excedrin, and narcotics.  This will help reduce risk of rebound headaches. 4. Be aware of common food triggers:  - Caffeine:  coffee, black tea, cola, Mt. Dew  - Chocolate  - Dairy:  aged cheeses (brie, blue, cheddar, gouda, [place], provolone, [place], Swiss, etc), chocolate milk, buttermilk, sour cream, limit eggs and yogurt  - Nuts, peanut butter  - Alcohol  - Cereals/grains:  FRESH breads (fresh bagels, sourdough, doughnuts), yeast productions  - Processed/canned/aged/cured meats (pre-packaged deli meats, hotdogs)  - MSG/glutamate:  soy sauce, flavor enhancer, pickled/preserved/marinated foods  - Sweeteners:  aspartame (Equal, Nutrasweet).  Sugar and Splenda are okay  - Vegetables:  legumes (lima beans, lentils, snow peas, fava beans, pinto peans, peas, garbanzo beans), sauerkraut, onions, olives, pickles  - Fruit:  avocados, bananas, citrus fruit (orange, lemon, grapefruit), mango  - Other:  Frozen meals, macaroni and cheese 5. Routine exercise 6. Stay adequately hydrated (aim for 64 oz water daily) 7. Keep headache diary 8. Maintain proper stress management 9. Maintain proper sleep hygiene 10. Do not skip meals 11. Consider supplements:  magnesium citrate 400mg  daily, riboflavin 400mg  daily, coenzyme Q10 100mg  three times daily. 12. Follow up 4 to 6 months.

## 2021-03-27 NOTE — Progress Notes (Signed)
NEUROLOGY FOLLOW UP OFFICE NOTE  Shannon Roy [id number]  Assessment/Plan:   1.  Migraine without aura, without status migrainosus, not intractable 2.  Medication overuse headache 3.  Left amaurosis fugax - likely migraine aura but cannot rule out central retinal artery occlusion. 4.  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  Morning headaches with daytime sleepiness - may be related to sleep deprivation (poor sleeper) but should evaluate for OSA   1.  Aimovig 140mg  every 28 days 2.  She will try Reyvow for migraine attack.  Triptans are contraindicated 3.  Must stop Advil 4.  MRA of head to follow up on aneurysm 5.  Refer to sleep medicine 6.  Follow up 6 months.  Subjective:  Shannon Roy is a 35 year oldleft-handed female with sickle cell anemia with history of stroke, asthma and migraines whofollows up for migraines and history of stroke.    UPDATE: She has a dull up 1-4/10 daily.  She 800mg  Advil daily.  After each Aimovig injection, she is headache-free for a week and then the dull daily headache returns.  She has 1 severe migraine a month, takes Percocet and lasts 2 hours.   Shannon Roy was ineffective Rescue: Percocet Current NSAIDS:Advil 800mg  QD, ASA 81mg  Current analgesics:Percocet Current triptans:none Current ergotamine:none Current anti-emetic:none Current muscle relaxants:none Current anti-anxiolytic:none Current sleep aide:none Current Antihypertensive medications:none Current Antidepressant medications:none Current Anticonvulsant medications:none Current anti-CGRP:Aimovig 140mg  Current Vitamins/Herbal/Supplements:none Current Antihistamines/Decongestants:none Other therapy:none Hormone/birth control:none  Caffeine:1 cup of coffee 10 days a month; 1 cup of tea 10 days a month; drinks a Coke 2 days a month Diet:6 bottles of water daily Exercise:Not routine Depression:Moderate depression; Anxiety:yes Other  pain:Sickle cell crisis infrequently Sleep hygiene:Poor. Difficulty falling asleep and then staying asleep.  HISTORY: She started having a persistent headache since age 16. She has had multiple MRIs in the past. Headaches vary (may be unilateral either side, frontal or back of neck. It is pounding. They are typically at 2-3/10 but they may become 8-10/10 usually lasting 1 to 4 hours and occur once a month. Typical associated symptoms include burning on the head, photophobia, phonophobia, nausea, osmophobia. No associated vomiting, visual disturbance or unilateral numbness or weakness. No identifiable trigger. Vomiting and resting in dark room helps relieve. She went to the ED on 09/07/2019 because she had a severe left sided migraine associated with complete vision loss of left eye for 20-30 minutes followed by blurred vision in the left eye and tingling around the eye. Blurred vision lasted 8 hours. Unclear if it was migraine arua vs left amaurosis fugax/central retinal artery occlusion.  MRI of brain with and without contrast from 09/07/2019 was personally reviewed and was normal.  CTA of head and neck was performed on 04/13/2020 showed minimal calcified plaque within the bilateral ICA siphons and possible 2 mm Acomm aneurysm but no significant intracranial or extracranial stenosis.    She sometimes has positional vertigo.  Other history: She reports that in 6th grade, she began having syncopal spellsin which she vision blacked out and then lost consciousness. At the time, she was told they were "mini strokes". Episodes resolved after about 1 year.  Rescue therapy: Aleve and BC for mild to moderate headache; percocet for severe headache. Frequency of analgesics: Aleve and BC daily.   Past NSAIDS:Ibuprofen, naproxen, flurbiprofen Past analgesics:Fioricet; oxycodone, BC powder Past abortive triptans:Maxalt 10mg ; sumatriptan 50mg  Past abortive ergotamine:none Past  muscle relaxants:Robaxin Past anti-emetic:Reglan Past antihypertensive medications:none Past antidepressant medications:Amitriptyline(ineffective, drowsiness) Past anticonvulsant medications:Topiramate (ineffective) Past anti-CGRP:Nurtec,  Shannon Roy Past vitamins/Herbal/Supplements:none Past antihistamines/decongestants:none Other past therapies:none   Family history of headache:Mom (migraines), 2 sisters (migraines), 3 maternal aunts (migraines)  PAST MEDICAL HISTORY: Past Medical History:  Diagnosis Date  . Anal fissure   . Asthma   . Blood transfusion without reported diagnosis   . Dermoid cyst    iguinal area  . Eczema   . Migraines   . Murmur, cardiac   . Sickle cell anemia (HCC)   . Stroke [facility])     MEDICATIONS: Current Outpatient Medications on File Prior to Visit  Medication Sig Dispense Refill  . albuterol (VENTOLIN HFA) 108 (90 Base) MCG/ACT inhaler Inhale 1-2 puffs into the lungs every 6 (six) hours as needed for wheezing or shortness of breath.    . AMBULATORY NON FORMULARY MEDICATION Medication Name: Use Nitroglycerin 0.125% pea sized amount three times a day for 6-8 weeks 30 g 1  . AMBULATORY NON FORMULARY MEDICATION Nitroglycerin ointment 0.125%  Use a pea sized amount rectally three times a day for 6-8 weeks 30 g 0  . clobetasol cream (TEMOVATE) 9.15 % Apply 1 application topically daily. (Patient not taking: No sig reported)    . EPINEPHrine 0.3 mg/0.3 mL IJ SOAJ injection Inject 0.3 mg into the muscle daily as needed for anaphylaxis.    Shannon Roy (AIMOVIG) 140 MG/ML SOAJ Inject 140 mg into the skin every 28 (twenty-eight) days. 1 pen 11  . folic acid (FOLVITE) 1 MG tablet Take 1 mg by mouth daily.    . hydroxypropyl methylcellulose / hypromellose (ISOPTO TEARS / GONIOVISC) 2.5 % ophthalmic solution Place 1 drop into both eyes 3 (three) times daily as needed for dry eyes. (Patient not taking: Reported on 11/26/2020)    . hydrOXYzine  (ATARAX/VISTARIL) 10 MG tablet 1-2 tab po q 8 hrs prn itching 30 tablet 0  . lidocaine (XYLOCAINE) 5 % ointment Apply 1 application topically as needed. 35.44 g 0  . mupirocin ointment (BACTROBAN) 2 % Apply to area one time daily. (Patient not taking: No sig reported) 22 g 0  . oxyCODONE-acetaminophen (PERCOCET/ROXICET) 5-325 MG tablet Take 1-2 tablets by mouth every 4 (four) hours as needed for severe pain.     . pantoprazole (PROTONIX) 40 MG tablet Take 40 mg by mouth daily.    Marland Kitchen triamcinolone cream (KENALOG) 0.5 % Apply 1 application topically daily.     No current facility-administered medications on file prior to visit.    ALLERGIES: Allergies  Allergen Reactions  . Bee Venom Swelling  . Pollen Extract Other (See Comments)    Seasonal allergies     FAMILY HISTORY: Family History  Problem Relation Age of Onset  . Diabetes Mother   . Sickle cell trait Sister   . Sickle cell trait Sister   . Depression Sister   . Breast cancer Maternal Aunt   . Diabetes Maternal Aunt   . Breast cancer Maternal Aunt   . Diabetes Maternal Aunt   . Colon cancer Neg Hx   . Stomach cancer Neg Hx   . Pancreatic cancer Neg Hx   . Liver cancer Neg Hx   . Esophageal cancer Neg Hx       Objective:  Blood pressure 116/73, pulse 87, height 5\' 7"  (1.702 m), weight 182 lb 12.8 oz (82.9 kg), SpO2 99 %. General: No acute distress.  Patient appears well-groomed.    Metta Clines, DO  CC: Geryl Councilman, [place]

## 2021-03-29 ENCOUNTER — Other Ambulatory Visit: Payer: Self-pay

## 2021-03-29 ENCOUNTER — Encounter: Payer: Self-pay | Admitting: Neurology

## 2021-03-29 ENCOUNTER — Ambulatory Visit: Payer: Medicaid Other | Admitting: Neurology

## 2021-03-29 VITALS — BP 116/73 | HR 87 | Ht 67.0 in | Wt 182.8 lb

## 2021-03-29 DIAGNOSIS — G453 Amaurosis fugax: Secondary | ICD-10-CM

## 2021-03-29 DIAGNOSIS — G43009 Migraine without aura, not intractable, without status migrainosus: Secondary | ICD-10-CM | POA: Diagnosis not present

## 2021-03-29 DIAGNOSIS — G4719 Other hypersomnia: Secondary | ICD-10-CM

## 2021-03-29 DIAGNOSIS — G4440 Drug-induced headache, not elsewhere classified, not intractable: Secondary | ICD-10-CM

## 2021-03-29 DIAGNOSIS — I671 Cerebral aneurysm, nonruptured: Secondary | ICD-10-CM | POA: Diagnosis not present

## 2021-03-29 MED ORDER — REYVOW 100 MG PO TABS: 100.0000 mg | ORAL_TABLET | ORAL | 0 refills | Status: DC | PRN

## 2021-03-29 NOTE — Patient Instructions (Signed)
1.  Check MRA of head 2.  Aimovig 140mg  every 28 days 3.  STOP ADVIL 4.  When you get a migraine, take Reyvow 100mg .  No more than one dose in 24 hours.  If that is not effective, then the next time you have a migraine, I want you to try taking 200mg  (no more than one dose in 24 hours).  If either dose effective, contact me for prescription.  No driving for 8 hours after use. 5.  Limit use of pain relievers to no more than 2 days out of week to prevent risk of rebound or medication-overuse headache. 6.  Keep headache diary 7.  Refer to sleep medicine for evaluation of OSA

## 2021-04-06 ENCOUNTER — Other Ambulatory Visit: Payer: Medicaid Other

## 2021-04-19 ENCOUNTER — Other Ambulatory Visit: Payer: Self-pay | Admitting: Neurology

## 2021-04-19 NOTE — Telephone Encounter (Signed)
Patient called to check on the status of this. She said she needs to pick it up today to stay on schedule.

## 2021-04-25 ENCOUNTER — Institutional Professional Consult (permissible substitution): Payer: Medicaid Other | Admitting: Pulmonary Disease

## 2021-04-25 ENCOUNTER — Other Ambulatory Visit: Payer: Self-pay

## 2021-04-25 ENCOUNTER — Ambulatory Visit (INDEPENDENT_AMBULATORY_CARE_PROVIDER_SITE_OTHER): Payer: Medicaid Other | Admitting: Pulmonary Disease

## 2021-04-25 ENCOUNTER — Encounter: Payer: Self-pay | Admitting: Pulmonary Disease

## 2021-04-25 VITALS — BP 112/64 | HR 93 | Temp 97.9°F | Ht 65.2 in | Wt 182.2 lb

## 2021-04-25 DIAGNOSIS — R0683 Snoring: Secondary | ICD-10-CM

## 2021-04-25 DIAGNOSIS — G4700 Insomnia, unspecified: Secondary | ICD-10-CM | POA: Insufficient documentation

## 2021-04-25 DIAGNOSIS — F5104 Psychophysiologic insomnia: Secondary | ICD-10-CM

## 2021-04-25 NOTE — Patient Instructions (Signed)
Rules of sleep hygiene were discussed  - light exercise -avoid caffeinated beverages - no more than 20 mins staying awake in bed, if not asleep, get out of bed & reading or light music - No TV or computer games at bedtime.  Okay to read or listen to sleep app -Delay bedtime to 10-11 PM to try and consolidate sleep to 6 hours  Okay to trial melatonin 5 to 10 mg around 8 PM Contact behavior sleep therapist for insomnia  Schedule home sleep test

## 2021-04-25 NOTE — Assessment & Plan Note (Signed)
Rules of sleep hygiene were discussed  - light exercise -avoid caffeinated beverages - no more than 20 mins staying awake in bed, if not asleep, get out of bed & reading or light music - No TV or computer games at bedtime.  Okay to read or listen to sleep app -Delay bedtime to 10-11 PM to try and consolidate sleep to 6 hours  Okay to trial melatonin 5 to 10 mg around 8 PM Suggest cognitive behavioral therapy and she was given a contact for therapist for insomnia

## 2021-04-25 NOTE — Progress Notes (Signed)
Subjective:    Patient ID: Shannon Roy, female    DOB: 12/03/1986, 35 y.o.   MRN: [id number]  HPI  Chief Complaint  Patient presents with  . Consult    Reports having difficulty falling asleep at night or after falling asleep around 10 pm will wake up in the middle of the night as well as snoring per her sister. She is having daytime fatigue and dozing off during the day.    35 year old with sickle cell anemia and migraines referred by neurology for evaluation of morning headaches to rule out sleep disordered breathing  PMH -migraines, sickle cell disease -well-controlled, 1 transfusion a year, minor crisis, mild intermittent asthma, severe eczema She has mild exercise-induced asthma and uses albuterol on an as-needed basis.  She has severe eczema which seems to have recurred as an adult and Dupixent has been prescribed.  She reports severe itching and has tried Benadryl and hydroxyzine with limited benefit.  She reports excessive daytime somnolence, Epworth sleepiness score is 16 and she reports sleepiness while watching TV, as a passenger in her car lying down to rest in the afternoons.  This is affecting her concentration at work her sister drives her into work on 20-minute drive to [employer].  She reports difficulty falling asleep every night and nocturnal awakening between 1:30 AM to 3:30 AM. Bedtime is around 830 and it takes her about 1 to 2 hours to fall asleep.  She sleeps on her left side with 1 pillow and uses her sleep smart phone application to lower her to sleep.  She reports 1-2 nocturnal awakenings including a prolonged awakening between 130 and 3:30 AM.  Her alarm then goes off at 5 AM when she stenosis until 6 AM and wakes up feeling tired with dryness of mouth and occasional headaches.  Her weight is fluctuated within 10 pounds.  Her sister has noted loud snoring. There is a cat that shares her bed and a dog that sleeps on the floor There is no history suggestive of  cataplexy, sleep paralysis or parasomnias      Past Medical History:  Diagnosis Date  . Anal fissure   . Asthma   . Blood transfusion without reported diagnosis   . Dermoid cyst    iguinal area  . Eczema   . Migraines   . Murmur, cardiac   . Pneumonia   . Sickle cell anemia (HCC)   . Stroke [facility])     Past Surgical History:  Procedure Laterality Date  . ANKLE SURGERY Right 2002   broken after a fall  . anklesurgery Right 2015   to have hardware removed  . CHOLECYSTECTOMY    . DERMOID CYST  EXCISION Bilateral    2018, removed from inguinal area  . TONSILLECTOMY      Allergies  Allergen Reactions  . Bee Venom Swelling  . Pollen Extract Other (See Comments)    Seasonal allergies     Social History   Socioeconomic History  . Marital status: Single    Spouse name: Not on file  . Number of children: 0  . Years of education: Not on file  . Highest education level: Not on file  Occupational History  . 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 Smoking status: Never Smoker  . Smokeless tobacco: Never Used  Vaping Use  . Vaping Use: Never used  Substance and Sexual Activity  . Alcohol use: Not Currently  . Drug use: Not Currently  . Sexual activity: Not  Currently  Other Topics Concern  . Not on file  Social History Narrative   No children   [employer]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   Lives with her youngest sister   Cat and a dog   Enjoys hiking, photography, restaurants   Left handed   Two story home   Social Determinants of Health   Financial Resource Strain: Not on file  Food Insecurity: Not on file  Transportation Needs: Not on file  Physical Activity: Not on file  Stress: Not on file  Social Connections: Not on file  Intimate Partner Violence: Not on file     Family History  Problem Relation Age of Onset  . Diabetes Mother   . Sickle cell trait Sister   . Sickle cell trait Sister   . Depression Sister   . Breast cancer Maternal Aunt   .  Diabetes Maternal Aunt   . Breast cancer Maternal Aunt   . Diabetes Maternal Aunt   . Colon cancer Neg Hx   . Stomach cancer Neg Hx   . Pancreatic cancer Neg Hx   . Liver cancer Neg Hx   . Esophageal cancer Neg Hx      Review of Systems Positive itching Headaches especially in the afternoons Increase somnolence   Constitutional: negative for anorexia, fevers and sweats  Eyes: negative for irritation, redness and visual disturbance  Ears, nose, mouth, throat, and face: negative for earaches, epistaxis, nasal congestion and sore throat  Respiratory: negative for cough, dyspnea on exertion, sputum and wheezing  Cardiovascular: negative for chest pain, dyspnea, lower extremity edema, orthopnea, palpitations and syncope  Gastrointestinal: negative for abdominal pain, constipation, diarrhea, melena, nausea and vomiting  Genitourinary:negative for dysuria, frequency and hematuria  Hematologic/lymphatic: negative for bleeding, easy bruising and lymphadenopathy  Musculoskeletal:negative for arthralgias, muscle weakness and stiff joints  Neurological: negative for coordination problems, gait problems, headaches and weakness  Endocrine: negative for diabetic symptoms including polydipsia, polyuria and weight loss     Objective:   Physical Exam  Gen. Pleasant, well-nourished, in no distress, normal affect ENT - no pallor,icterus, no post nasal drip , normal bite Neck: No JVD, no thyromegaly, no carotid bruits Lungs: no use of accessory muscles, no dullness to percussion, clear without rales or rhonchi  Cardiovascular: Rhythm regular, heart sounds  normal, no murmurs or gallops, no peripheral edema Abdomen: soft and non-tender, no hepatosplenomegaly, BS normal. Musculoskeletal: No deformities, no cyanosis or clubbing Neuro:  alert, non focal Skin-severe eczematous changes over both hands and back       Assessment & Plan:

## 2021-04-25 NOTE — Assessment & Plan Note (Signed)
Given excessive daytime somnolence, narrow pharyngeal exam & loud snoring, obstructive sleep apnea is possible & an overnight polysomnogram will be scheduled as a home study. The pathophysiology of obstructive sleep apnea , it's cardiovascular consequences & modes of treatment including CPAP were discused with the patient in detail & they evidenced understanding.

## 2021-06-13 ENCOUNTER — Other Ambulatory Visit: Payer: Self-pay

## 2021-06-13 ENCOUNTER — Ambulatory Visit: Payer: Medicaid Other

## 2021-06-13 DIAGNOSIS — R0683 Snoring: Secondary | ICD-10-CM

## 2021-06-13 DIAGNOSIS — G4733 Obstructive sleep apnea (adult) (pediatric): Secondary | ICD-10-CM | POA: Diagnosis not present

## 2021-06-19 ENCOUNTER — Telehealth: Payer: Self-pay | Admitting: Pulmonary Disease

## 2021-06-19 DIAGNOSIS — G4733 Obstructive sleep apnea (adult) (pediatric): Secondary | ICD-10-CM | POA: Diagnosis not present

## 2021-06-19 NOTE — Telephone Encounter (Signed)
Spoke with pt and notified of results per Dr. Elsworth Soho. Pt verbalized understanding and denied any questions. She declined any tx options at this time and will focus on wt loss and avoid supine position.

## 2021-06-19 NOTE — Telephone Encounter (Signed)
   HST showed mild  OSA with AHI 10/ hr Events were worse in the supine position.  Treatment options include -No treatment with focus on weight loss and avoid sleeping on her back.  -Dental appliance -CPAP  If she needs further discussion please schedule office visit with me/APP

## 2021-07-22 ENCOUNTER — Telehealth: Payer: Self-pay

## 2021-07-22 ENCOUNTER — Other Ambulatory Visit: Payer: Self-pay

## 2021-07-22 ENCOUNTER — Ambulatory Visit
Admission: RE | Admit: 2021-07-22 | Discharge: 2021-07-22 | Disposition: A | Discharge status: Home / Self Care | Payer: Medicaid Other | Source: Ambulatory Visit | Attending: Urgent Care | Admitting: Urgent Care

## 2021-07-22 VITALS — BP 146/82 | HR 74 | Temp 98.8°F | Resp 20

## 2021-07-22 DIAGNOSIS — K118 Other diseases of salivary glands: Secondary | ICD-10-CM | POA: Diagnosis not present

## 2021-07-22 NOTE — ED Provider Notes (Signed)
CHIEF COMPLAINT:   Chief Complaint  Patient presents with   Facial Swelling   Sore Throat   Otalgia     SUBJECTIVE/HPI:   Sore Throat  Otalgia A very pleasant 35 y.o.Female presents today with right-sided neck swelling, sore throat and right ear discomfort.  Patient reports some associated right-sided headache.  Patient reports that she feels as if she is "swallowing a ball".  On the right side.  Patient does not report any fever, chills, vomiting or additional symptoms today.  She has not tried any over-the-counter remedies.   has a past medical history of Anal fissure, Asthma, Blood transfusion without reported diagnosis, Dermoid cyst, Eczema, Migraines, Murmur, cardiac, Pneumonia, Sickle cell anemia ([place]), and Stroke ([place]).  ROS:  Review of Systems  HENT:  Positive for ear pain.   See Subjective/HPI Medications, Allergies and Problem List personally reviewed in Epic today OBJECTIVE:   Vitals:   07/22/21 1602  BP: (!) 146/82  Pulse: 74  Resp: 20  Temp: 98.8 F (37.1 C)  SpO2: 98%    Physical Exam   General: Appears well-developed and well-nourished. No acute distress.  HEENT Head: Normocephalic and atraumatic.   Ears: Hearing grossly intact, no drainage or visible deformity.  Nose: No nasal deviation.   Mouth/Throat: No stridor or tracheal deviation.  Non erythematous posterior pharynx noted with clear drainage present.  No white patchy exudate noted. Eyes: Conjunctivae and EOM are normal. No eye drainage or scleral icterus bilaterally.  Neck: Normal range of motion, neck is supple. No cervical, tonsillar or submandibular lymph nodes palpated.  Right-sided neck swelling and palpation reveals likely swollen parotid.  No overlying erythema. Cardiovascular: Normal rate Pulm/Chest: No respiratory distress Neurological: Alert and oriented to person, place, and time.  Skin: Skin is warm and dry.  No rashes, lesions, abrasions or bruising noted to skin.   Psychiatric:  Normal mood, affect, behavior, and thought content.   Vital signs and nursing note reviewed.   Patient stable and cooperative with examination. PROCEDURES:    LABS/X-RAYS/EKG/MEDS:   No results found for any visits on 07/22/21.  MEDICAL DECISION MAKING:   Patient presents with right-sided neck swelling, sore throat and right ear discomfort.  Patient reports some associated right-sided headache.  Patient reports that she feels as if she is "swallowing a ball".  On the right side.  Patient does not report any fever, chills, vomiting or additional symptoms today.  She has not tried any over-the-counter remedies.  Chart review completed.  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  Advised to suck on hard lemon candies, massage the area throughout the day, apply warm compresses to the right neck region.  Advised to return for any worsening pain, redness to skin, heat, throat closing sensation, shortness of breath or uncontrolled fevers.  Return as needed.  Patient verbalized understanding and agreed with treatment plan.  Patient stable upon discharge. ASSESSMENT/PLAN:  1. Parotid duct obstruction  Instructions about new medications and side effects provided.  Plan:   Discharge Instructions      Suck on hard lemon candies  Massage the area throughout the day  Apply warm compresses to the right neck region  Return for any worsening pain, redness to skin, heat, throat closing sensation, shortness of breath, or uncontrolled fevers.        Serafina Royals, [place] 07/22/21 [phone]

## 2021-07-22 NOTE — ED Triage Notes (Signed)
Pt here with right jaw swelling and pain along with right ear pain and sore throat when swallowing on that side.

## 2021-07-22 NOTE — Discharge Instructions (Addendum)
Suck on hard lemon candies  Massage the area throughout the day  Apply warm compresses to the right neck region  Return for any worsening pain, redness to skin, heat, throat closing sensation, shortness of breath, or uncontrolled fevers.

## 2021-07-22 NOTE — Telephone Encounter (Signed)
Shannon Roy (KeyMF:[id number]) Aimovig '140MG'$ /ML auto-injectors   Form [employer] [facility] Electronic [place] Form [phone] NCPDP) Created 5 days ago Sent to Plan 4 minutes ago Plan Response 4 minutes ago Submit Clinical Questions less than a minute ago Determination Wait for Determination Please wait for [employer] [facility] to return a determination.

## 2021-07-30 NOTE — Telephone Encounter (Signed)
F/u   Approved, Coverage Starts on: 07/22/2021 12:00:00 AM, Coverage Ends on: 07/22/2022 12:00:00 AM.  Message from Plan  PA Case: [id number], Status:

## 2021-08-05 NOTE — Telephone Encounter (Signed)
New message   Determination  Message from Plan  Available without authorization.  Kathi Simpers (Key: [id number]) Aimovig '140MG'$ /ML auto-injectors   Form [employer] [facility] [employer] Electronic [place] Form [phone] NCPDP) Created 4 days ago Sent to Plan less than a minute ago Plan Response less than a minute ago Submit Clinical Questions

## 2021-10-02 NOTE — Progress Notes (Deleted)
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 Keep headache diary Follow up ***   Subjective:  Gilbert Narain is a 35 year old left-handed female with sickle cell anemia with history of stroke, asthma and migraines who follows up for migraines and history of stroke.     UPDATE: She has a dull up 1-4/10 daily.  She 800mg  Advil daily.  After each Aimovig injection, she is headache-free for a week and then the dull daily headache returns.  She has 1 severe migraine a month, takes Percocet and lasts 2 hours.   Reyvow???? MRA of head was not performed.  Rescue protocol:  *** Current NSAIDS:  ASA 81mg  Current analgesics:  Percocet Current triptans:  none Current ergotamine:  none Current anti-emetic:  none Current muscle relaxants:  none Current anti-anxiolytic:  none Current sleep aide:  none Current Antihypertensive medications:  none Current Antidepressant medications:  none Current Anticonvulsant medications:  none Current anti-CGRP:  Aimovig 140mg  Current Vitamins/Herbal/Supplements:  none Current Antihistamines/Decongestants:  none Other therapy:  none Hormone/birth control:  none   Caffeine:  1 cup of coffee 10 days a month; 1 cup of tea 10 days a month; drinks a Coke 2 days a month Diet:  6 bottles of water daily Exercise:  Not routine Depression:  Moderate depression; Anxiety:  yes Other pain:  Sickle cell crisis infrequently Sleep hygiene:  Poor.  Difficulty falling asleep and then staying asleep.   HISTORY:  She started having a persistent headache since age 35.  She  has had multiple MRIs in the past.  Headaches vary (may be unilateral either side, frontal or back of neck.  It is pounding.  They are typically at 2-3/10 but they may become 8-10/10 usually lasting 1 to 4 hours and occur once a month.  Typical associated symptoms include burning on the head, photophobia, phonophobia, nausea, osmophobia.  No associated vomiting, visual disturbance or unilateral numbness or weakness.  No identifiable trigger.  Vomiting and resting in dark room helps relieve.  She went to the ED on 09/07/2019 because she had a severe left sided migraine associated with complete vision loss of left eye for 20-30 minutes followed by blurred vision in the left eye and tingling around the eye.  Blurred vision lasted 8 hours.  Unclear if it was migraine arua vs left amaurosis fugax/central retinal artery occlusion.  MRI of brain with and without contrast from 09/07/2019 was personally reviewed and was normal.  CTA of head and neck was performed on 04/13/2020 showed minimal calcified plaque within the bilateral ICA siphons and possible 2 mm Acomm aneurysm but no significant intracranial or extracranial stenosis.     She sometimes has positional vertigo.   Other history:  She reports that in 6th grade, she began having syncopal spells in which she vision blacked out and then lost consciousness.  At the time, she was told they were "mini strokes".  Episodes resolved after about 1 year.  Past NSAIDS:  Ibuprofen, naproxen, flurbiprofen Past analgesics:  Fioricet; oxycodone, BC powder Past abortive triptans:  Maxalt 10mg ; sumatriptan 50mg  Past abortive ergotamine:  none Past muscle relaxants:  Robaxin Past anti-emetic:  Reglan Past antihypertensive medications:  none Past antidepressant medications:  Amitriptyline (ineffective, drowsiness) Past anticonvulsant medications:  Topiramate (ineffective) Past anti-CGRP:  Nurtec, Ubrelvy Past vitamins/Herbal/Supplements:  none Past  antihistamines/decongestants:  none Other past therapies:  none     Family history of headache:  Mom (migraines), 2 sisters (migraines), 3 maternal aunts (migraines)  PAST MEDICAL HISTORY: Past Medical History:  Diagnosis Date   Anal fissure    Asthma    Blood transfusion without reported diagnosis    Dermoid cyst    iguinal area   Eczema    Migraines    Murmur, cardiac    Pneumonia    Sickle cell anemia (HCC)    Stroke (HCC)     MEDICATIONS: Current Outpatient Medications on File Prior to Visit  Medication Sig Dispense Refill   AIMOVIG 140 MG/ML SOAJ INJECT 140 MG INTO THE SKIN EVERY 28 (TWENTY-EIGHT) DAYS. 1 mL 5   albuterol (VENTOLIN HFA) 108 (90 Base) MCG/ACT inhaler Inhale 1-2 puffs into the lungs every 6 (six) hours as needed for wheezing or shortness of breath.     AMBULATORY NON FORMULARY MEDICATION Medication Name: Use Nitroglycerin 0.125% pea sized amount three times a day for 6-8 weeks 30 g 1   AMBULATORY NON FORMULARY MEDICATION Nitroglycerin ointment 0.125%  Use a pea sized amount rectally three times a day for 6-8 weeks 30 g 0   clobetasol cream (TEMOVATE) 2.54 % Apply 1 application topically daily.     EPINEPHrine 0.3 mg/0.3 mL IJ SOAJ injection Inject 0.3 mg into the muscle daily as needed for anaphylaxis.     folic acid (FOLVITE) 1 MG tablet Take 1 mg by mouth daily.     hydroxypropyl methylcellulose / hypromellose (ISOPTO TEARS / GONIOVISC) 2.5 % ophthalmic solution Place 1 drop into both eyes 3 (three) times daily as needed for dry eyes.     hydrOXYzine (ATARAX/VISTARIL) 10 MG tablet 1-2 tab po q 8 hrs prn itching 30 tablet 0   Lasmiditan Succinate (REYVOW) 100 MG TABS Take 100 mg by mouth as needed. (Patient not taking: Reported on 04/25/2021) 3 tablet 0   oxyCODONE-acetaminophen (PERCOCET/ROXICET) 5-325 MG tablet Take 1-2 tablets by mouth every 4 (four) hours as needed for severe pain.      pantoprazole (PROTONIX) 40 MG tablet Take 40 mg by mouth daily.      triamcinolone cream (KENALOG) 0.5 % Apply 1 application topically daily.     No current facility-administered medications on file prior to visit.    ALLERGIES: Allergies  Allergen Reactions   Bee Venom Swelling   Pollen Extract Other (See Comments)    Seasonal allergies     FAMILY HISTORY: Family History  Problem Relation Age of Onset   Diabetes Mother    Sickle cell trait Sister    Sickle cell trait Sister    Depression Sister    Breast cancer Maternal Aunt    Diabetes Maternal Aunt    Breast cancer Maternal Aunt    Diabetes Maternal Aunt    Colon cancer Neg Hx    Stomach cancer Neg Hx    Pancreatic cancer Neg Hx    Liver cancer Neg Hx    Esophageal cancer Neg Hx       Objective:  *** General: No acute distress.  Patient appears well-groomed.   Head:  Normocephalic/atraumatic Eyes:  Fundi examined but not visualized Neck: supple, no paraspinal tenderness, full range of motion Heart:  Regular rate and rhythm Lungs:  Clear to auscultation bilaterally Back: No paraspinal tenderness Neurological Exam: alert and oriented to person, place, and time.  Speech fluent and not dysarthric, language intact.  CN II-XII intact. Bulk and tone normal, muscle strength 5/5 throughout.  Sensation to light touch intact.  Deep tendon reflexes 2+ throughout, toes downgoing.  Finger to nose testing intact.  Gait normal, Romberg negative.   Metta Clines, DO  CC: Geryl Councilman, PA-C

## 2021-10-04 ENCOUNTER — Ambulatory Visit: Payer: Medicaid Other | Admitting: Neurology

## 2021-10-16 ENCOUNTER — Other Ambulatory Visit: Payer: Self-pay | Admitting: Neurology

## 2021-10-21 NOTE — Progress Notes (Signed)
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 Keep headache diary Follow up 6 months   Subjective:  Shannon Roy is a 35 year old left-handed female with sickle cell anemia with history of stroke, asthma and migraines who follows up for migraines and history of stroke.     UPDATE: She has a dull up 1-4/10 daily.  She 800mg  Advil daily.  After each Aimovig injection, she is headache-free for a week and then the dull daily headache returns.  She has 1 severe migraine a month, takes Percocet and lasts 2 hours.   Migraines are severe, usually all day and occur once a month.  She had a sickle cell crisis in September.  Afterwards, she had a migraine lasting a full week.  She tried Reyvow 100mg  which was effective.  She never received a call to schedule MRA   Current NSAIDS:  ASA 81mg  Current analgesics:  Percocet Current triptans:  none Current ergotamine:  none Current anti-emetic:  none Current muscle relaxants:  none Current anti-anxiolytic:  none Current sleep aide:  none Current Antihypertensive medications:  none Current Antidepressant medications:  none Current Anticonvulsant medications:  none Current anti-CGRP:  Aimovig 140mg  Current Vitamins/Herbal/Supplements:  none Current Antihistamines/Decongestants:  none Other therapy:  none Hormone/birth control:  none   Caffeine:  1 cup of coffee 10 days a month; 1 cup of tea 10 days a month; drinks a Coke 2 days a month Diet:  6 bottles of water daily Exercise:  Not routine Depression:   Moderate depression; Anxiety:  yes Other pain:  Sickle cell crisis infrequently Sleep hygiene:  Some difficulty falling asleep.  May take 2 hours to fall asleep but will wake up 2 hours later.     HISTORY:  She started having a persistent headache since age 55.  She has had multiple MRIs in the past.  Headaches vary (may be unilateral either side, frontal or back of neck.  It is pounding.  They are typically at 2-3/10 but they may become 8-10/10 usually lasting 1 to 4 hours and occur once a month.  Typical associated symptoms include burning on the head, photophobia, phonophobia, nausea, osmophobia.  No associated vomiting, visual disturbance or unilateral numbness or weakness.  No identifiable trigger.  Vomiting and resting in dark room helps relieve.  She went to the ED on 09/07/2019 because she had a severe left sided migraine associated with complete vision loss of left eye for 20-30 minutes followed by blurred vision in the left eye and tingling around the eye.  Blurred vision lasted 8 hours.  Unclear if it was migraine arua vs left amaurosis fugax/central retinal artery occlusion.  MRI of brain with and without contrast from 09/07/2019 was personally reviewed and was normal.  CTA of head and neck was performed on 04/13/2020 showed minimal calcified plaque within the bilateral ICA siphons and possible 2 mm Acomm aneurysm but no significant intracranial or extracranial stenosis.     She sometimes has positional vertigo.   Other history:  She reports that in 6th grade, she began having syncopal spells  in which she vision blacked out and then lost consciousness.  At the time, she was told they were "mini strokes".  Episodes resolved after about 1 year.  Past NSAIDS:  Ibuprofen, naproxen, flurbiprofen Past analgesics:  Fioricet; oxycodone, BC powder Past abortive triptans:  Maxalt 10mg ; sumatriptan 50mg  Past abortive ergotamine:  none Past muscle relaxants:  Robaxin Past anti-emetic:  Reglan Past  antihypertensive medications:  none Past antidepressant medications:  Amitriptyline (ineffective, drowsiness) Past anticonvulsant medications:  Topiramate (ineffective) Past anti-CGRP:  Nurtec, Ubrelvy Past vitamins/Herbal/Supplements:  none Past antihistamines/decongestants:  none Other past therapies:  none     Family history of headache:  Mom (migraines), 2 sisters (migraines), 3 maternal aunts (migraines)  PAST MEDICAL HISTORY: Past Medical History:  Diagnosis Date   Anal fissure    Asthma    Blood transfusion without reported diagnosis    Dermoid cyst    iguinal area   Eczema    Migraines    Murmur, cardiac    Pneumonia    Sickle cell anemia (HCC)    Stroke (HCC)     MEDICATIONS: Current Outpatient Medications on File Prior to Visit  Medication Sig Dispense Refill   AIMOVIG 140 MG/ML SOAJ INJECT 140 MG INTO THE SKIN EVERY 28 (TWENTY-EIGHT) DAYS. 1 mL 5   albuterol (VENTOLIN HFA) 108 (90 Base) MCG/ACT inhaler Inhale 1-2 puffs into the lungs every 6 (six) hours as needed for wheezing or shortness of breath.     AMBULATORY NON FORMULARY MEDICATION Medication Name: Use Nitroglycerin 0.125% pea sized amount three times a day for 6-8 weeks 30 g 1   AMBULATORY NON FORMULARY MEDICATION Nitroglycerin ointment 0.125%  Use a pea sized amount rectally three times a day for 6-8 weeks 30 g 0   clobetasol cream (TEMOVATE) 2.11 % Apply 1 application topically daily.     EPINEPHrine 0.3 mg/0.3 mL IJ SOAJ injection Inject 0.3 mg into the muscle daily as needed for anaphylaxis.     folic acid (FOLVITE) 1 MG tablet Take 1 mg by mouth daily.     hydroxypropyl methylcellulose / hypromellose (ISOPTO TEARS / GONIOVISC) 2.5 % ophthalmic solution Place 1 drop into both eyes 3 (three) times daily as needed for dry eyes.     hydrOXYzine (ATARAX/VISTARIL) 10 MG tablet 1-2 tab po q 8 hrs prn itching 30 tablet 0   Lasmiditan Succinate (REYVOW) 100 MG TABS Take 100 mg by mouth as needed. (Patient not  taking: Reported on 04/25/2021) 3 tablet 0   oxyCODONE-acetaminophen (PERCOCET/ROXICET) 5-325 MG tablet Take 1-2 tablets by mouth every 4 (four) hours as needed for severe pain.      pantoprazole (PROTONIX) 40 MG tablet Take 40 mg by mouth daily.     triamcinolone cream (KENALOG) 0.5 % Apply 1 application topically daily.     No current facility-administered medications on file prior to visit.    ALLERGIES: Allergies  Allergen Reactions   Bee Venom Swelling   Pollen Extract Other (See Comments)    Seasonal allergies     FAMILY HISTORY: Family History  Problem Relation Age of Onset   Diabetes Mother    Sickle cell trait Sister    Sickle cell trait Sister    Depression Sister    Breast cancer Maternal Aunt    Diabetes Maternal Aunt    Breast cancer Maternal Aunt    Diabetes Maternal Aunt    Colon cancer Neg Hx    Stomach cancer Neg Hx    Pancreatic cancer Neg Hx  Liver cancer Neg Hx    Esophageal cancer Neg Hx       Objective:  Blood pressure 129/82, pulse 100, height 5\' 7"  (1.702 m), weight 184 lb 9.6 oz (83.7 kg), SpO2 99 %. General: No acute distress.  Patient appears well-groomed.   Head:  Normocephalic/atraumatic Eyes:  Fundi examined but not visualized Neck: supple, no paraspinal tenderness, full range of motion Back: No paraspinal tenderness Neurological Exam: alert and oriented to person, place, and time.  Speech fluent and not dysarthric, language intact.  CN II-XII intact. Bulk and tone normal, muscle strength 5/5 throughout.  Sensation to light touch intact.  Deep tendon reflexes 2+ throughout, toes downgoing.  Finger to nose testing intact.  Gait normal, Romberg negative.   Metta Clines, DO  CC: Geryl Councilman, PA-C

## 2021-10-22 ENCOUNTER — Other Ambulatory Visit: Payer: Self-pay

## 2021-10-22 ENCOUNTER — Encounter: Payer: Self-pay | Admitting: Neurology

## 2021-10-22 ENCOUNTER — Ambulatory Visit: Payer: Medicaid Other | Admitting: Neurology

## 2021-10-22 VITALS — BP 129/82 | HR 100 | Ht 67.0 in | Wt 184.6 lb

## 2021-10-22 DIAGNOSIS — I671 Cerebral aneurysm, nonruptured: Secondary | ICD-10-CM

## 2021-10-22 DIAGNOSIS — G43009 Migraine without aura, not intractable, without status migrainosus: Secondary | ICD-10-CM

## 2021-10-22 MED ORDER — REYVOW 100 MG PO TABS: 100.0000 mg | ORAL_TABLET | Freq: Every day | ORAL | 5 refills | Status: DC | PRN

## 2021-10-22 MED ORDER — REYVOW 100 MG PO TABS: ORAL_TABLET | ORAL | 5 refills | Status: DC

## 2021-10-22 NOTE — Patient Instructions (Addendum)
Aimovig every 28 days Take Reyvow 100mg  for migraine (maximum 100mg  in 24 hours).  No driving for 8 hours after use. MRA of head Follow up 6 months

## 2021-10-22 NOTE — Progress Notes (Signed)
Medication Samples have been provided to the patient.  Drug name: Reyvow       Strength: 50 mg        Qty: 4   LOT: [id number]  Exp.Date: 03/2022  Dosing instructions: as needed  The patient has been instructed regarding the correct time, dose, and frequency of taking this medication, including desired effects and most common side effects.   Venetia Night 10:34 AM 10/22/2021

## 2021-10-28 ENCOUNTER — Telehealth: Payer: Self-pay

## 2021-10-28 NOTE — Telephone Encounter (Signed)
New message - website CoverMyMeds  This request has received a Favorable outcome.  Please note any additional information provided by [employer] [facility] at the bottom of this request.  Shannon Roy (Key: [facility]) Rx #: [id number] Reyvow 100MG  tablets   Form [employer] [facility] [place] Form [phone] NCPDP) Created 6 days ago Sent to Plan 2 minutes ago Plan Response 2 minutes ago Submit Clinical Questions less than a minute ago Determination Favorable less than a minute ago Message from Plan PA Case: [id number], Status: Approved, Coverage Starts on: 10/28/2021 12:00:00 AM, Coverage Ends on: 10/28/2022 12:00:00 AM.

## 2021-11-12 ENCOUNTER — Other Ambulatory Visit: Payer: Self-pay

## 2021-11-23 ENCOUNTER — Other Ambulatory Visit: Payer: Self-pay

## 2021-11-23 ENCOUNTER — Ambulatory Visit
Admission: RE | Admit: 2021-11-23 | Discharge: 2021-11-23 | Disposition: A | Discharge status: Home / Self Care | Payer: Medicaid Other | Source: Ambulatory Visit | Attending: Neurology | Admitting: Neurology

## 2021-11-23 DIAGNOSIS — I671 Cerebral aneurysm, nonruptured: Secondary | ICD-10-CM

## 2021-11-27 ENCOUNTER — Telehealth: Payer: Self-pay

## 2021-11-27 NOTE — Telephone Encounter (Signed)
Pls see result note from 12/19. There is no change in the repeat MRA from her prior scan with outpouching seen, read again as "may represent a small aneurysm," similar to her prior scans. Thanks

## 2021-11-27 NOTE — Telephone Encounter (Signed)
Patient advised of her MRA head results..   Pt wanted to know if the imaging showed she had aneurysm or not.

## 2021-11-27 NOTE — Telephone Encounter (Signed)
Pt advised of 11/25/21 per Dr.Aqunio.

## 2022-04-10 ENCOUNTER — Other Ambulatory Visit: Payer: Self-pay | Admitting: Neurology

## 2022-04-23 ENCOUNTER — Ambulatory Visit: Payer: Medicaid Other | Admitting: Neurology

## 2022-05-26 ENCOUNTER — Other Ambulatory Visit: Payer: Self-pay | Admitting: Neurology

## 2022-05-29 NOTE — Progress Notes (Unsigned)
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 Keep headache diary Follow up 6 months     Subjective:  Shannon Roy is a 36 year old left-handed female with sickle cell anemia with history of stroke, asthma and migraines who follows up for migraines and history of stroke.     UPDATE: MRA of head on 11/23/2021 personally reviewed revealed stable 2 mm inferiorly projecting outpouching arisking from the anterior communicating artery possibly representing a small aneurysm.  Intensity:  *** Duration:  *** Frequency:  *** Current NSAIDS:  ASA '81mg'$  Current analgesics:  Percocet Current triptans:  none Current ergotamine:  none Current anti-emetic:  none Current muscle relaxants:  none Current anti-anxiolytic:  none Current sleep aide:  none Current Antihypertensive medications:  none Current Antidepressant medications:  none Current Anticonvulsant medications:  none Current anti-CGRP:  Aimovig '140mg'$  Current Vitamins/Herbal/Supplements:  none Current Antihistamines/Decongestants:  none Other therapy:  Reyvow '100mg'$  (rescue) Hormone/birth control:  none   Caffeine:  1 cup of coffee 10 days a month; 1 cup of tea 10 days a month; drinks a Coke 2 days a month Diet:  6 bottles of water daily Exercise:  Not routine Depression:  Moderate depression; Anxiety:  yes Other pain:  Sickle cell crisis infrequently Sleep hygiene:  Some difficulty falling asleep.  May take 2 hours to fall asleep but will wake up 2 hours later.     HISTORY:  She  started having a persistent headache since age 84.  She has had multiple MRIs in the past.  Headaches vary (may be unilateral either side, frontal or back of neck.  It is pounding.  They are typically at 2-3/10 but they may become 8-10/10 usually lasting 1 to 4 hours and occur once a month.  Typical associated symptoms include burning on the head, photophobia, phonophobia, nausea, osmophobia.  No associated vomiting, visual disturbance or unilateral numbness or weakness.  No identifiable trigger.  Vomiting and resting in dark room helps relieve.  She went to the ED on 09/07/2019 because she had a severe left sided migraine associated with complete vision loss of left eye for 20-30 minutes followed by blurred vision in the left eye and tingling around the eye.  Blurred vision lasted 8 hours.  Unclear if it was migraine arua vs left amaurosis fugax/central retinal artery occlusion.  MRI of brain with and without contrast from 09/07/2019 was personally reviewed and was normal.  CTA of head and neck was performed on 04/13/2020 showed minimal calcified plaque within the bilateral ICA siphons and possible 2 mm Acomm aneurysm but no significant intracranial or extracranial stenosis.     She sometimes has positional vertigo.   Other history:  She reports that in 6th grade, she began having syncopal spells in which she vision blacked out and then lost consciousness.  At the time, she was told they were "mini strokes".  Episodes resolved after about 1 year.   Past NSAIDS:  Ibuprofen, naproxen, flurbiprofen Past analgesics:  Fioricet; oxycodone, BC powder Past abortive triptans:  Maxalt '10mg'$ ; sumatriptan '50mg'$  Past abortive ergotamine:  none Past muscle relaxants:  Robaxin Past anti-emetic:  Reglan Past antihypertensive medications:  none Past antidepressant medications:  Amitriptyline (ineffective, drowsiness) Past anticonvulsant medications:  Topiramate (ineffective) Past anti-CGRP:  Nurtec, Ubrelvy Past  vitamins/Herbal/Supplements:  none Past antihistamines/decongestants:  none Other past therapies:  none     Family history of headache:  Mom (migraines), 2 sisters (migraines), 3 maternal aunts (migraines)  PAST MEDICAL HISTORY: Past Medical History:  Diagnosis Date   Anal fissure    Asthma    Blood transfusion without reported diagnosis    Dermoid cyst    iguinal area   Eczema    Migraines    Murmur, cardiac    Pneumonia    Sickle cell anemia (HCC)    Stroke (HCC)     MEDICATIONS: Current Outpatient Medications on File Prior to Visit  Medication Sig Dispense Refill   AIMOVIG 140 MG/ML SOAJ INJECT 140 MG INTO THE SKIN EVERY 28 (TWENTY-EIGHT) DAYS. 1 mL 0   albuterol (VENTOLIN HFA) 108 (90 Base) MCG/ACT inhaler Inhale 1-2 puffs into the lungs every 6 (six) hours as needed for wheezing or shortness of breath.     AMBULATORY NON FORMULARY MEDICATION Medication Name: Use Nitroglycerin 0.125% pea sized amount three times a day for 6-8 weeks (Patient not taking: Reported on 10/22/2021) 30 g 1   AMBULATORY NON FORMULARY MEDICATION Nitroglycerin ointment 0.125%  Use a pea sized amount rectally three times a day for 6-8 weeks (Patient not taking: Reported on 10/22/2021) 30 g 0   clobetasol cream (TEMOVATE) 6.06 % Apply 1 application topically daily.     EPINEPHrine 0.3 mg/0.3 mL IJ SOAJ injection Inject 0.3 mg into the muscle daily as needed for anaphylaxis.     folic acid (FOLVITE) 1 MG tablet Take 1 mg by mouth daily.     hydroxypropyl methylcellulose / hypromellose (ISOPTO TEARS / GONIOVISC) 2.5 % ophthalmic solution Place 1 drop into both eyes 3 (three) times daily as needed for dry eyes. (Patient not taking: Reported on 10/22/2021)     hydrOXYzine (ATARAX/VISTARIL) 10 MG tablet 1-2 tab po q 8 hrs prn itching 30 tablet 0   Lasmiditan Succinate (REYVOW) 100 MG TABS Take 100 mg by mouth daily as needed (maximum 1 tablet in 24 hours). 8 tablet 5   oxyCODONE-acetaminophen  (PERCOCET/ROXICET) 5-325 MG tablet Take 1-2 tablets by mouth every 4 (four) hours as needed for severe pain.      pantoprazole (PROTONIX) 40 MG tablet Take 40 mg by mouth daily.     triamcinolone cream (KENALOG) 0.5 % Apply 1 application topically daily.     No current facility-administered medications on file prior to visit.    ALLERGIES: Allergies  Allergen Reactions   Bee Venom Swelling   Pollen Extract Other (See Comments)    Seasonal allergies     FAMILY HISTORY: Family History  Problem Relation Age of Onset   Diabetes Mother    Sickle cell trait Sister    Sickle cell trait Sister    Depression Sister    Breast cancer Maternal Aunt    Diabetes Maternal Aunt    Breast cancer Maternal Aunt    Diabetes Maternal Aunt    Colon cancer Neg Hx    Stomach cancer Neg Hx    Pancreatic cancer Neg Hx    Liver cancer Neg Hx    Esophageal cancer Neg Hx       Objective:  *** General: No acute distress.  Patient appears ***-groomed.   Head:  Normocephalic/atraumatic  Eyes:  Fundi examined but not visualized Neck: supple, no paraspinal tenderness, full range of motion Heart:  Regular rate and rhythm Lungs:  Clear to auscultation bilaterally Back: No paraspinal tenderness Neurological Exam: alert and oriented to person, place, and time.  Speech fluent and not dysarthric, language intact.  CN II-XII intact. Bulk and tone normal, muscle strength 5/5 throughout.  Sensation to light touch intact.  Deep tendon reflexes 2+ throughout, toes downgoing.  Finger to nose testing intact.  Gait normal, Romberg negative.   Metta Clines, DO  CC: ***

## 2022-05-30 ENCOUNTER — Ambulatory Visit: Payer: Medicaid Other | Admitting: Neurology

## 2022-05-30 ENCOUNTER — Encounter: Payer: Self-pay | Admitting: Neurology

## 2022-05-30 VITALS — BP 110/68 | HR 91 | Ht 67.0 in | Wt 180.6 lb

## 2022-05-30 DIAGNOSIS — G43009 Migraine without aura, not intractable, without status migrainosus: Secondary | ICD-10-CM | POA: Diagnosis not present

## 2022-05-30 DIAGNOSIS — I671 Cerebral aneurysm, nonruptured: Secondary | ICD-10-CM | POA: Diagnosis not present

## 2022-05-30 MED ORDER — ZONISAMIDE 50 MG PO CAPS: ORAL_CAPSULE | ORAL | 0 refills | Status: DC

## 2022-05-30 MED ORDER — AIMOVIG 140 MG/ML ~~LOC~~ SOAJ: 140.0000 mg | SUBCUTANEOUS | 5 refills | Status: DC

## 2022-06-25 ENCOUNTER — Telehealth (HOSPITAL_COMMUNITY): Payer: Self-pay | Admitting: Pharmacy Technician

## 2022-06-25 NOTE — Telephone Encounter (Signed)
Patient Advocate Encounter  Prior Authorization for Aimovig '140MG'$ /ML auto-injectors has been approved.    PA# [id number] Effective dates: 06/25/2022 through 06/25/2023      Lyndel Safe, [place] Patient Advocate Specialist [place] Patient Advocate Team Direct Number: [phone]  Fax: [phone]

## 2022-06-25 NOTE — Telephone Encounter (Signed)
Patient Advocate Encounter   Received notification that prior authorization for Aimovig '140MG'$ /ML auto-injectors is required.   PA submitted on 06/25/2022 Key [id number] Status is pending       Lyndel Safe, [place] Patient Advocate Specialist [place] Patient Advocate Team Direct Number: [phone]  Fax: [phone]

## 2022-07-01 ENCOUNTER — Other Ambulatory Visit: Payer: Self-pay | Admitting: Neurology

## 2022-07-30 ENCOUNTER — Other Ambulatory Visit: Payer: Self-pay | Admitting: Neurology

## 2022-08-14 ENCOUNTER — Other Ambulatory Visit: Payer: Self-pay | Admitting: Neurology

## 2022-09-23 ENCOUNTER — Telehealth: Payer: Self-pay | Admitting: Urgent Care

## 2022-09-23 NOTE — Telephone Encounter (Signed)
Please give pt a call today the patient said she did not receive her refund yet for when she seen dr Ethelene Hal in [place]. 2023 and pt paid cash but pt said she had a cpe done.

## 2022-09-23 NOTE — Telephone Encounter (Signed)
Error wrong patient

## 2022-09-30 NOTE — Progress Notes (Signed)
NEUROLOGY FOLLOW UP OFFICE NOTE  Shannon Roy [id number]  Assessment/Plan:   Migraine without aura, without status migrainosus, not intractable - improved with stress reduction Left amaurosis fugax - likely migraine aura but cannot rule out central retinal artery occlusion  2 mm ACOM aneurysm   Migraine prevention:  Continue Aimovig '140mg'$  every 28 days.  Migraine rescue:  2 Aleve with tea Limit use of pain relievers to no more than 2 days out of week to prevent risk of rebound or medication-overuse headache. Keep headache diary Repeat MRA of head in one year Follow up 6 months     Subjective:  Shannon Roy is a 36 year old left-handed female with sickle cell anemia with history of stroke, asthma and migraines who follows up for migraines and history of stroke.     UPDATE: Due to ongoing frequent low-grade headaches, added zonisamide last visit.  She also quit her job which was causing significant stress and affecting her sleep.   Headaches improved.  She will be starting a new job. Intensity:  3-4/10 Duration:  10-15 minutes (usually when she gets up in the morning) Frequency: 5 in last 30 days   Current NSAIDS:  Aleve, ASA '81mg'$  Current analgesics:  Percocet Current triptans:  none Current ergotamine:  none Current anti-emetic:  none Current muscle relaxants:  none Current anti-anxiolytic:  none Current sleep aide:  none Current Antihypertensive medications:  none Current Antidepressant medications:  none Current Anticonvulsant medications: none Current anti-CGRP:  Aimovig '140mg'$  Current Vitamins/Herbal/Supplements:  none Current Antihistamines/Decongestants:  none Other therapy:  Reyvow '100mg'$  (rescue) Hormone/birth control:  none   Caffeine:  1 cup of coffee 10 days a month; 1 cup of tea 10 days a month; drinks a Coke 2 days a month Diet:  6 bottles of water daily Exercise:  Not routine Depression:  Moderate depression; Anxiety:  yes Other pain:  Sickle cell  crisis infrequently Sleep hygiene:  Some difficulty falling asleep.  May take 2 hours to fall asleep but will wake up 2 hours later.     HISTORY:  She started having a persistent headache since age 77.  She has had multiple MRIs in the past.  Headaches vary (may be unilateral either side, frontal or back of neck.  It is pounding.  They are typically at 2-3/10 but they may become 8-10/10 usually lasting 1 to 4 hours and occur once a month.  Typical associated symptoms include burning on the head, photophobia, phonophobia, nausea, osmophobia.  No associated vomiting, visual disturbance or unilateral numbness or weakness.  No identifiable trigger.  Vomiting and resting in dark room helps relieve.  She went to the ED on 09/07/2019 because she had a severe left sided migraine associated with complete vision loss of left eye for 20-30 minutes followed by blurred vision in the left eye and tingling around the eye.  Blurred vision lasted 8 hours.  Unclear if it was migraine arua vs left amaurosis fugax/central retinal artery occlusion.  MRI of brain with and without contrast from 09/07/2019 was personally reviewed and was normal.  CTA of head and neck was performed on 04/13/2020 showed minimal calcified plaque within the bilateral ICA siphons and possible 2 mm Acomm aneurysm but no significant intracranial or extracranial stenosis.  MRA of head on 11/23/2021 personally reviewed revealed stable 2 mm inferiorly projecting outpouching arisking from the anterior communicating artery possibly representing a small aneurysm.   She sometimes has positional vertigo.   Other history:  She reports that in  6th grade, she began having syncopal spells in which she vision blacked out and then lost consciousness.  At the time, she was told they were "mini strokes".  Episodes resolved after about 1 year.   Past NSAIDS:  Ibuprofen, naproxen, flurbiprofen Past analgesics:  Fioricet; oxycodone, BC powder Past abortive triptans:  Maxalt  '10mg'$ ; sumatriptan '50mg'$  Past abortive ergotamine:  none Past muscle relaxants:  Robaxin Past anti-emetic:  Reglan Past antihypertensive medications:  none Past antidepressant medications:  Amitriptyline (ineffective, drowsiness) Past anticonvulsant medications:  Topiramate (ineffective) Past anti-CGRP:  Nurtec, Ubrelvy Past vitamins/Herbal/Supplements:  none Past antihistamines/decongestants:  none Other past therapies:  none     Family history of headache:  Mom (migraines), 2 sisters (migraines), 3 maternal aunts (migraines)  PAST MEDICAL HISTORY: Past Medical History:  Diagnosis Date   Anal fissure    Asthma    Blood transfusion without reported diagnosis    Dermoid cyst    iguinal area   Eczema    Migraines    Murmur, cardiac    Pneumonia    Sickle cell anemia (HCC)    Stroke [facility])     MEDICATIONS: Current Outpatient Medications on File Prior to Visit  Medication Sig Dispense Refill   albuterol (VENTOLIN HFA) 108 (90 Base) MCG/ACT inhaler Inhale 1-2 puffs into the lungs every 6 (six) hours as needed for wheezing or shortness of breath.     clobetasol cream (TEMOVATE) 2.68 % Apply 1 application topically daily.     dupilumab (DUPIXENT) 300 MG/2ML prefilled syringe      EPINEPHrine 0.3 mg/0.3 mL IJ SOAJ injection Inject 0.3 mg into the muscle daily as needed for anaphylaxis.     Erenumab-aooe (AIMOVIG) 140 MG/ML SOAJ Inject 140 mg into the skin every 28 (twenty-eight) days. 1 mL 5   EUCRISA 2 % OINT SMARTSIG:sparingly Topical Twice Daily     fluticasone (FLOVENT HFA) 44 MCG/ACT inhaler Inhale 1 puff into the lungs 2 (two) times daily.     folic acid (FOLVITE) 1 MG tablet Take 1 mg by mouth daily.     hydroxypropyl methylcellulose / hypromellose (ISOPTO TEARS / GONIOVISC) 2.5 % ophthalmic solution Place 1 drop into both eyes 3 (three) times daily as needed for dry eyes. (Patient not taking: Reported on 10/22/2021)     hydrOXYzine (ATARAX/VISTARIL) 10 MG tablet 1-2 tab po q 8  hrs prn itching 30 tablet 0   Lasmiditan Succinate (REYVOW) 100 MG TABS Take 100 mg by mouth daily as needed (maximum 1 tablet in 24 hours). 8 tablet 5   Olopatadine HCl 0.2 % SOLN 1 drop.     oxyCODONE-acetaminophen (PERCOCET/ROXICET) 5-325 MG tablet Take 1-2 tablets by mouth every 4 (four) hours as needed for severe pain.      pantoprazole (PROTONIX) 40 MG tablet Take 40 mg by mouth daily.     triamcinolone cream (KENALOG) 0.5 % Apply 1 application topically daily.     zonisamide (ZONEGRAN) 50 MG capsule TAKE 2 CAPSULES BY MOUTH EVERY DAY 180 capsule 0   No current facility-administered medications on file prior to visit.    ALLERGIES: Allergies  Allergen Reactions   Bee Venom Swelling   Pollen Extract Other (See Comments)    Seasonal allergies     FAMILY HISTORY: Family History  Problem Relation Age of Onset   Diabetes Mother    Sickle cell trait Sister    Sickle cell trait Sister    Depression Sister    Breast cancer Maternal Aunt    Diabetes  Maternal Aunt    Breast cancer Maternal Aunt    Diabetes Maternal Aunt    Colon cancer Neg Hx    Stomach cancer Neg Hx    Pancreatic cancer Neg Hx    Liver cancer Neg Hx    Esophageal cancer Neg Hx       Objective:  Blood pressure (!) 130/59, pulse 81, height '5\' 7"'$  (1.702 m), weight 171 lb 9.6 oz (77.8 kg), SpO2 93 %. General: No acute distress.  Patient appears well-groomed.     Metta Clines, DO  CC: Geryl Councilman, [place]

## 2022-10-03 ENCOUNTER — Ambulatory Visit: Payer: Medicaid Other | Admitting: Neurology

## 2022-10-03 ENCOUNTER — Encounter: Payer: Self-pay | Admitting: Neurology

## 2022-10-03 VITALS — BP 130/59 | HR 81 | Ht 67.0 in | Wt 171.6 lb

## 2022-10-03 DIAGNOSIS — I671 Cerebral aneurysm, nonruptured: Secondary | ICD-10-CM | POA: Diagnosis not present

## 2022-10-03 DIAGNOSIS — G43009 Migraine without aura, not intractable, without status migrainosus: Secondary | ICD-10-CM | POA: Diagnosis not present

## 2022-12-22 DIAGNOSIS — G43909 Migraine, unspecified, not intractable, without status migrainosus: Secondary | ICD-10-CM | POA: Insufficient documentation

## 2022-12-22 DIAGNOSIS — E559 Vitamin D deficiency, unspecified: Secondary | ICD-10-CM | POA: Insufficient documentation

## 2022-12-22 DIAGNOSIS — Z9049 Acquired absence of other specified parts of digestive tract: Secondary | ICD-10-CM | POA: Insufficient documentation

## 2022-12-22 DIAGNOSIS — K219 Gastro-esophageal reflux disease without esophagitis: Secondary | ICD-10-CM | POA: Insufficient documentation

## 2023-01-03 ENCOUNTER — Other Ambulatory Visit: Payer: Self-pay | Admitting: Neurology

## 2023-04-08 NOTE — Progress Notes (Deleted)
NEUROLOGY FOLLOW UP OFFICE NOTE  Shannon Roy [id number]  Assessment/Plan:   Migraine without aura, without status migrainosus, not intractable - improved with stress reduction Left amaurosis fugax - likely migraine aura but cannot rule out central retinal artery occlusion  2 mm ACOM aneurysm   Migraine prevention:  Aimovig 140mg  every 28 days.  *** Migraine rescue:  2 Aleve with tea *** Limit use of pain relievers to no more than 2 days out of week to prevent risk of rebound or medication-overuse headache. Keep headache diary Repeat MRA of head in 6 months. Follow up 6 months     Subjective:  Shannon Roy is a 37 year old left-handed female with sickle cell anemia with history of stroke, asthma and migraines who follows up for migraines and history of stroke.     UPDATE: Intensity:  3-4/10 *** Duration:  10-15 minutes (usually when she gets up in the morning) *** Frequency: 5 in last 30 days ***   Current NSAIDS:  Aleve, ASA 81mg  Current analgesics:  Percocet Current triptans:  none Current ergotamine:  none Current anti-emetic:  none Current muscle relaxants:  none Current anti-anxiolytic:  none Current sleep aide:  none Current Antihypertensive medications:  none Current Antidepressant medications:  none Current Anticonvulsant medications: none Current anti-CGRP:  Aimovig 140mg  Current Vitamins/Herbal/Supplements:  none Current Antihistamines/Decongestants:  none Other therapy:  Reyvow 100mg  (rescue) Hormone/birth control:  none   Caffeine:  1 cup of coffee 10 days a month; 1 cup of tea 10 days a month; drinks a Coke 2 days a month Diet:  6 bottles of water daily Exercise:  Not routine Depression:  Moderate depression; Anxiety:  yes Other pain:  Sickle cell crisis infrequently Sleep hygiene:  Some difficulty falling asleep.  May take 2 hours to fall asleep but will wake up 2 hours later.     HISTORY:  She started having a persistent headache since age 54.   She has had multiple MRIs in the past.  Headaches vary (may be unilateral either side, frontal or back of neck.  It is pounding.  They are typically at 2-3/10 but they may become 8-10/10 usually lasting 1 to 4 hours and occur once a month.  Typical associated symptoms include burning on the head, photophobia, phonophobia, nausea, osmophobia.  No associated vomiting, visual disturbance or unilateral numbness or weakness.  No identifiable trigger.  Vomiting and resting in dark room helps relieve.  She went to the ED on 09/07/2019 because she had a severe left sided migraine associated with complete vision loss of left eye for 20-30 minutes followed by blurred vision in the left eye and tingling around the eye.  Blurred vision lasted 8 hours.  Unclear if it was migraine arua vs left amaurosis fugax/central retinal artery occlusion.  MRI of brain with and without contrast from 09/07/2019 was personally reviewed and was normal.  CTA of head and neck was performed on 04/13/2020 showed minimal calcified plaque within the bilateral ICA siphons and possible 2 mm Acomm aneurysm but no significant intracranial or extracranial stenosis.  MRA of head on 11/23/2021 personally reviewed revealed stable 2 mm inferiorly projecting outpouching arisking from the anterior communicating artery possibly representing a small aneurysm.   She sometimes has positional vertigo.   Other history:  She reports that in 6th grade, she began having syncopal spells in which she vision blacked out and then lost consciousness.  At the time, she was told they were "mini strokes".  Episodes resolved after about  1 year.   Past NSAIDS:  Ibuprofen, naproxen, flurbiprofen Past analgesics:  Fioricet; oxycodone, BC powder Past abortive triptans:  Maxalt 10mg ; sumatriptan 50mg  Past abortive ergotamine:  none Past muscle relaxants:  Robaxin Past anti-emetic:  Reglan Past antihypertensive medications:  none Past antidepressant medications:  Amitriptyline  (ineffective, drowsiness) Past anticonvulsant medications:  Topiramate (ineffective) Past anti-CGRP:  Nurtec, Ubrelvy Past vitamins/Herbal/Supplements:  none Past antihistamines/decongestants:  none Other past therapies:  none     Family history of headache:  Mom (migraines), 2 sisters (migraines), 3 maternal aunts (migraines)  PAST MEDICAL HISTORY: Past Medical History:  Diagnosis Date   Anal fissure    Asthma    Blood transfusion without reported diagnosis    Dermoid cyst    iguinal area   Eczema    Migraines    Murmur, cardiac    Pneumonia    Sickle cell anemia (HCC)    Stroke [facility])     MEDICATIONS: Current Outpatient Medications on File Prior to Visit  Medication Sig Dispense Refill   albuterol (VENTOLIN HFA) 108 (90 Base) MCG/ACT inhaler Inhale 1-2 puffs into the lungs every 6 (six) hours as needed for wheezing or shortness of breath.     clobetasol cream (TEMOVATE) 0.05 % Apply 1 application topically daily.     dupilumab (DUPIXENT) 300 MG/2ML prefilled syringe      EPINEPHrine 0.3 mg/0.3 mL IJ SOAJ injection Inject 0.3 mg into the muscle daily as needed for anaphylaxis.     Erenumab-aooe (AIMOVIG) 140 MG/ML SOAJ INJECT 140 MG INTO THE SKIN EVERY 28 (TWENTY-EIGHT) DAYS. 1 mL 5   EUCRISA 2 % OINT SMARTSIG:sparingly Topical Twice Daily     fluticasone (FLOVENT HFA) 44 MCG/ACT inhaler Inhale 1 puff into the lungs 2 (two) times daily.     folic acid (FOLVITE) 1 MG tablet Take 1 mg by mouth daily.     hydroxypropyl methylcellulose / hypromellose (ISOPTO TEARS / GONIOVISC) 2.5 % ophthalmic solution Place 1 drop into both eyes 3 (three) times daily as needed for dry eyes.     hydrOXYzine (ATARAX/VISTARIL) 10 MG tablet 1-2 tab po q 8 hrs prn itching 30 tablet 0   Olopatadine HCl 0.2 % SOLN 1 drop.     oxyCODONE-acetaminophen (PERCOCET/ROXICET) 5-325 MG tablet Take 1-2 tablets by mouth every 4 (four) hours as needed for severe pain.      pantoprazole (PROTONIX) 40 MG tablet Take  40 mg by mouth daily.     triamcinolone cream (KENALOG) 0.5 % Apply 1 application topically daily.     No current facility-administered medications on file prior to visit.    ALLERGIES: Allergies  Allergen Reactions   Bee Venom Swelling   Pollen Extract Other (See Comments)    Seasonal allergies     FAMILY HISTORY: Family History  Problem Relation Age of Onset   Diabetes Mother    Sickle cell trait Sister    Sickle cell trait Sister    Depression Sister    Breast cancer Maternal Aunt    Diabetes Maternal Aunt    Breast cancer Maternal Aunt    Diabetes Maternal Aunt    Colon cancer Neg Hx    Stomach cancer Neg Hx    Pancreatic cancer Neg Hx    Liver cancer Neg Hx    Esophageal cancer Neg Hx       Objective:  *** General: No acute distress.  Patient appears well-groomed.   Head:  Normocephalic/atraumatic Eyes:  Fundi examined but not visualized Neck: supple,  no paraspinal tenderness, full range of motion Heart:  Regular rate and rhythm Neurological Exam: alert and oriented to person, place, and time.  Speech fluent and not dysarthric, language intact.  CN II-XII intact. Bulk and tone normal, muscle strength 5/5 throughout.  Sensation to light touch intact.  Deep tendon reflexes 2+ throughout, toes downgoing.  Finger to nose testing intact.  Gait normal, Romberg negative.   Shon Millet, DO  CC: Guy Sandifer, [place]

## 2023-04-10 ENCOUNTER — Ambulatory Visit: Payer: Medicaid Other | Admitting: Neurology

## 2023-04-10 NOTE — Progress Notes (Deleted)
 NEUROLOGY FOLLOW UP OFFICE NOTE  Shannon Roy [id number]  Assessment/Plan:   Migraine without aura, without status migrainosus, not intractable - improved with stress reduction Left amaurosis fugax - likely migraine aura but cannot rule out central retinal artery occlusion  2 mm ACOM aneurysm   Migraine prevention:  Aimovig 140mg every 28 days.  *** Migraine rescue:  2 Aleve with tea *** Limit use of pain relievers to no more than 2 days out of week to prevent risk of rebound or medication-overuse headache. Keep headache diary Repeat MRA of head in 6 months. Follow up 6 months     Subjective:  Shannon Roy is a 36 year old left-handed female with sickle cell anemia with history of stroke, asthma and migraines who follows up for migraines and history of stroke.     UPDATE: Intensity:  3-4/10 *** Duration:  10-15 minutes (usually when she gets up in the morning) *** Frequency: 5 in last 30 days ***   Current NSAIDS:  Aleve, ASA 81mg Current analgesics:  Percocet Current triptans:  none Current ergotamine:  none Current anti-emetic:  none Current muscle relaxants:  none Current anti-anxiolytic:  none Current sleep aide:  none Current Antihypertensive medications:  none Current Antidepressant medications:  none Current Anticonvulsant medications: none Current anti-CGRP:  Aimovig 140mg Current Vitamins/Herbal/Supplements:  none Current Antihistamines/Decongestants:  none Other therapy:  Reyvow 100mg (rescue) Hormone/birth control:  none   Caffeine:  1 cup of coffee 10 days a month; 1 cup of tea 10 days a month; drinks a Coke 2 days a month Diet:  6 bottles of water daily Exercise:  Not routine Depression:  Moderate depression; Anxiety:  yes Other pain:  Sickle cell crisis infrequently Sleep hygiene:  Some difficulty falling asleep.  May take 2 hours to fall asleep but will wake up 2 hours later.     HISTORY:  She started having a persistent headache since age 18.   She has had multiple MRIs in the past.  Headaches vary (may be unilateral either side, frontal or back of neck.  It is pounding.  They are typically at 2-3/10 but they may become 8-10/10 usually lasting 1 to 4 hours and occur once a month.  Typical associated symptoms include burning on the head, photophobia, phonophobia, nausea, osmophobia.  No associated vomiting, visual disturbance or unilateral numbness or weakness.  No identifiable trigger.  Vomiting and resting in dark room helps relieve.  She went to the ED on 09/07/2019 because she had a severe left sided migraine associated with complete vision loss of left eye for 20-30 minutes followed by blurred vision in the left eye and tingling around the eye.  Blurred vision lasted 8 hours.  Unclear if it was migraine arua vs left amaurosis fugax/central retinal artery occlusion.  MRI of brain with and without contrast from 09/07/2019 was personally reviewed and was normal.  CTA of head and neck was performed on 04/13/2020 showed minimal calcified plaque within the bilateral ICA siphons and possible 2 mm Acomm aneurysm but no significant intracranial or extracranial stenosis.  MRA of head on 11/23/2021 personally reviewed revealed stable 2 mm inferiorly projecting outpouching arisking from the anterior communicating artery possibly representing a small aneurysm.   She sometimes has positional vertigo.   Other history:  She reports that in 6th grade, she began having syncopal spells in which she vision blacked out and then lost consciousness.  At the time, she was told they were "mini strokes".  Episodes resolved after about   1 year.   Past NSAIDS:  Ibuprofen, naproxen, flurbiprofen Past analgesics:  Fioricet; oxycodone, BC powder Past abortive triptans:  Maxalt 10mg; sumatriptan 50mg Past abortive ergotamine:  none Past muscle relaxants:  Robaxin Past anti-emetic:  Reglan Past antihypertensive medications:  none Past antidepressant medications:  Amitriptyline  (ineffective, drowsiness) Past anticonvulsant medications:  Topiramate (ineffective) Past anti-CGRP:  Nurtec, Ubrelvy Past vitamins/Herbal/Supplements:  none Past antihistamines/decongestants:  none Other past therapies:  none     Family history of headache:  Mom (migraines), 2 sisters (migraines), 3 maternal aunts (migraines)  PAST MEDICAL HISTORY: Past Medical History:  Diagnosis Date   Anal fissure    Asthma    Blood transfusion without reported diagnosis    Dermoid cyst    iguinal area   Eczema    Migraines    Murmur, cardiac    Pneumonia    Sickle cell anemia (HCC)    Stroke (HCC)     MEDICATIONS: Current Outpatient Medications on File Prior to Visit  Medication Sig Dispense Refill   albuterol (VENTOLIN HFA) 108 (90 Base) MCG/ACT inhaler Inhale 1-2 puffs into the lungs every 6 (six) hours as needed for wheezing or shortness of breath.     clobetasol cream (TEMOVATE) 0.05 % Apply 1 application topically daily.     dupilumab (DUPIXENT) 300 MG/2ML prefilled syringe      EPINEPHrine 0.3 mg/0.3 mL IJ SOAJ injection Inject 0.3 mg into the muscle daily as needed for anaphylaxis.     Erenumab-aooe (AIMOVIG) 140 MG/ML SOAJ INJECT 140 MG INTO THE SKIN EVERY 28 (TWENTY-EIGHT) DAYS. 1 mL 5   EUCRISA 2 % OINT SMARTSIG:sparingly Topical Twice Daily     fluticasone (FLOVENT HFA) 44 MCG/ACT inhaler Inhale 1 puff into the lungs 2 (two) times daily.     folic acid (FOLVITE) 1 MG tablet Take 1 mg by mouth daily.     hydroxypropyl methylcellulose / hypromellose (ISOPTO TEARS / GONIOVISC) 2.5 % ophthalmic solution Place 1 drop into both eyes 3 (three) times daily as needed for dry eyes.     hydrOXYzine (ATARAX/VISTARIL) 10 MG tablet 1-2 tab po q 8 hrs prn itching 30 tablet 0   Olopatadine HCl 0.2 % SOLN 1 drop.     oxyCODONE-acetaminophen (PERCOCET/ROXICET) 5-325 MG tablet Take 1-2 tablets by mouth every 4 (four) hours as needed for severe pain.      pantoprazole (PROTONIX) 40 MG tablet Take  40 mg by mouth daily.     triamcinolone cream (KENALOG) 0.5 % Apply 1 application topically daily.     No current facility-administered medications on file prior to visit.    ALLERGIES: Allergies  Allergen Reactions   Bee Venom Swelling   Pollen Extract Other (See Comments)    Seasonal allergies     FAMILY HISTORY: Family History  Problem Relation Age of Onset   Diabetes Mother    Sickle cell trait Sister    Sickle cell trait Sister    Depression Sister    Breast cancer Maternal Aunt    Diabetes Maternal Aunt    Breast cancer Maternal Aunt    Diabetes Maternal Aunt    Colon cancer Neg Hx    Stomach cancer Neg Hx    Pancreatic cancer Neg Hx    Liver cancer Neg Hx    Esophageal cancer Neg Hx       Objective:  *** General: No acute distress.  Patient appears well-groomed.   Head:  Normocephalic/atraumatic Eyes:  Fundi examined but not visualized Neck: supple,   no paraspinal tenderness, full range of motion Heart:  Regular rate and rhythm Neurological Exam: alert and oriented to person, place, and time.  Speech fluent and not dysarthric, language intact.  CN II-XII intact. Bulk and tone normal, muscle strength 5/5 throughout.  Sensation to light touch intact.  Deep tendon reflexes 2+ throughout, toes downgoing.  Finger to nose testing intact.  Gait normal, Romberg negative.   Kairav Russomanno, DO  CC: Whitney Crain, [place]       

## 2023-04-13 ENCOUNTER — Encounter: Payer: Self-pay | Admitting: Neurology

## 2023-04-13 ENCOUNTER — Ambulatory Visit: Payer: Medicaid Other | Admitting: Neurology

## 2023-05-19 ENCOUNTER — Ambulatory Visit: Payer: Medicaid Other | Admitting: Neurology

## 2023-05-19 NOTE — Progress Notes (Unsigned)
NEUROLOGY FOLLOW UP OFFICE NOTE  Shannon Roy [id number]  Assessment/Plan:   Migraine without aura, without status migrainosus, not intractable - improved with stress reduction Left amaurosis fugax - likely migraine aura but cannot rule out central retinal artery occlusion  2 mm ACOM aneurysm   Migraine prevention:  Aimovig 140mg  every 28 days *** Migraine rescue:  2 Aleve with tea *** Limit use of pain relievers to no more than 2 days out of week to prevent risk of rebound or medication-overuse headache. Keep headache diary Repeat MRA of head in 6 months Follow up 6 months     Subjective:  Shannon Roy is a 37 year old left-handed female with sickle cell anemia with history of stroke, asthma and migraines who follows up for migraines and history of stroke.     UPDATE:  Intensity:  3-4/10 Duration:  10-15 minutes (usually when she gets up in the morning) *** Frequency: 5 ***   Current NSAIDS:  Aleve, ASA 81mg  Current analgesics:  Percocet Current triptans:  none Current ergotamine:  none Current anti-emetic:  none Current muscle relaxants:  none Current anti-anxiolytic:  none Current sleep aide:  none Current Antihypertensive medications:  none Current Antidepressant medications:  none Current Anticonvulsant medications: none Current anti-CGRP:  Aimovig 140mg  Current Vitamins/Herbal/Supplements:  none Current Antihistamines/Decongestants:  none Other therapy:  Reyvow 100mg  (rescue) Hormone/birth control:  none   Caffeine:  1 cup of coffee 10 days a month; 1 cup of tea 10 days a month; drinks a Coke 2 days a month Diet:  6 bottles of water daily Exercise:  Not routine Depression:  Moderate depression; Anxiety:  yes Other pain:  Sickle cell crisis infrequently Sleep hygiene:  Some difficulty falling asleep.  May take 2 hours to fall asleep but will wake up 2 hours later.     HISTORY:  She started having a persistent headache since age 40.  She has had multiple  MRIs in the past.  Headaches vary (may be unilateral either side, frontal or back of neck.  It is pounding.  They are typically at 2-3/10 but they may become 8-10/10 usually lasting 1 to 4 hours and occur once a month.  Typical associated symptoms include burning on the head, photophobia, phonophobia, nausea, osmophobia.  No associated vomiting, visual disturbance or unilateral numbness or weakness.  No identifiable trigger.  Vomiting and resting in dark room helps relieve.  She went to the ED on 09/07/2019 because she had a severe left sided migraine associated with complete vision loss of left eye for 20-30 minutes followed by blurred vision in the left eye and tingling around the eye.  Blurred vision lasted 8 hours.  Unclear if it was migraine arua vs left amaurosis fugax/central retinal artery occlusion.  MRI of brain with and without contrast from 09/07/2019 was personally reviewed and was normal.  CTA of head and neck was performed on 04/13/2020 showed minimal calcified plaque within the bilateral ICA siphons and possible 2 mm Acomm aneurysm but no significant intracranial or extracranial stenosis.  MRA of head on 11/23/2021 personally reviewed revealed stable 2 mm inferiorly projecting outpouching arisking from the anterior communicating artery possibly representing a small aneurysm.   She sometimes has positional vertigo.   Other history:  She reports that in 6th grade, she began having syncopal spells in which she vision blacked out and then lost consciousness.  At the time, she was told they were "mini strokes".  Episodes resolved after about 1 year.   Past  NSAIDS:  Ibuprofen, naproxen, flurbiprofen Past analgesics:  Fioricet; oxycodone, BC powder Past abortive triptans:  Maxalt 10mg ; sumatriptan 50mg  Past abortive ergotamine:  none Past muscle relaxants:  Robaxin Past anti-emetic:  Reglan Past antihypertensive medications:  none Past antidepressant medications:  Amitriptyline (ineffective,  drowsiness) Past anticonvulsant medications:  Topiramate (ineffective) Past anti-CGRP:  Nurtec, Ubrelvy Past vitamins/Herbal/Supplements:  none Past antihistamines/decongestants:  none Other past therapies:  none     Family history of headache:  Mom (migraines), 2 sisters (migraines), 3 maternal aunts (migraines)  PAST MEDICAL HISTORY: Past Medical History:  Diagnosis Date   Anal fissure    Asthma    Blood transfusion without reported diagnosis    Dermoid cyst    iguinal area   Eczema    Migraines    Murmur, cardiac    Pneumonia    Sickle cell anemia (HCC)    Stroke [facility])     MEDICATIONS: Current Outpatient Medications on File Prior to Visit  Medication Sig Dispense Refill   albuterol (VENTOLIN HFA) 108 (90 Base) MCG/ACT inhaler Inhale 1-2 puffs into the lungs every 6 (six) hours as needed for wheezing or shortness of breath.     clobetasol cream (TEMOVATE) 0.05 % Apply 1 application topically daily.     dupilumab (DUPIXENT) 300 MG/2ML prefilled syringe      EPINEPHrine 0.3 mg/0.3 mL IJ SOAJ injection Inject 0.3 mg into the muscle daily as needed for anaphylaxis.     Erenumab-aooe (AIMOVIG) 140 MG/ML SOAJ Inject 140 mg into the skin every 28 (twenty-eight) days. 1 mL 5   EUCRISA 2 % OINT SMARTSIG:sparingly Topical Twice Daily     fluticasone (FLOVENT HFA) 44 MCG/ACT inhaler Inhale 1 puff into the lungs 2 (two) times daily.     folic acid (FOLVITE) 1 MG tablet Take 1 mg by mouth daily.     hydroxypropyl methylcellulose / hypromellose (ISOPTO TEARS / GONIOVISC) 2.5 % ophthalmic solution Place 1 drop into both eyes 3 (three) times daily as needed for dry eyes. (Patient not taking: Reported on 10/22/2021)     hydrOXYzine (ATARAX/VISTARIL) 10 MG tablet 1-2 tab po q 8 hrs prn itching 30 tablet 0   Lasmiditan Succinate (REYVOW) 100 MG TABS Take 100 mg by mouth daily as needed (maximum 1 tablet in 24 hours). 8 tablet 5   Olopatadine HCl 0.2 % SOLN 1 drop.     oxyCODONE-acetaminophen  (PERCOCET/ROXICET) 5-325 MG tablet Take 1-2 tablets by mouth every 4 (four) hours as needed for severe pain.      pantoprazole (PROTONIX) 40 MG tablet Take 40 mg by mouth daily.     triamcinolone cream (KENALOG) 0.5 % Apply 1 application topically daily.     zonisamide (ZONEGRAN) 50 MG capsule TAKE 2 CAPSULES BY MOUTH EVERY DAY 180 capsule 0   No current facility-administered medications on file prior to visit.    ALLERGIES: Allergies  Allergen Reactions   Bee Venom Swelling   Pollen Extract Other (See Comments)    Seasonal allergies     FAMILY HISTORY: Family History  Problem Relation Age of Onset   Diabetes Mother    Sickle cell trait Sister    Sickle cell trait Sister    Depression Sister    Breast cancer Maternal Aunt    Diabetes Maternal Aunt    Breast cancer Maternal Aunt    Diabetes Maternal Aunt    Colon cancer Neg Hx    Stomach cancer Neg Hx    Pancreatic cancer Neg Hx  Liver cancer Neg Hx    Esophageal cancer Neg Hx       Objective:  *** General: No acute distress.  Patient appears well-groomed.   Head:  Normocephalic/atraumatic Neck:  Supple.  No paraspinal tenderness.  Full range of motion. Heart:  Regular rate and rhythm. Neuro:  Alert and oriented.  Speech fluent and not dysarthric.  Language intact.  CN II-XII intact.  Bulk and tone normal.  Muscle strength 5/5 throughout.  Deep tendon reflexes 2+ throughout.  Gait normal.  Romberg negative.    Shon Millet, DO  CC: Guy Sandifer, [place]

## 2023-05-20 ENCOUNTER — Telehealth: Payer: Self-pay

## 2023-05-20 ENCOUNTER — Encounter: Payer: Self-pay | Admitting: Neurology

## 2023-05-20 ENCOUNTER — Ambulatory Visit: Payer: Medicaid Other | Admitting: Neurology

## 2023-05-20 VITALS — BP 123/65 | HR 83 | Ht 67.0 in | Wt 172.6 lb

## 2023-05-20 DIAGNOSIS — R200 Anesthesia of skin: Secondary | ICD-10-CM

## 2023-05-20 DIAGNOSIS — R202 Paresthesia of skin: Secondary | ICD-10-CM

## 2023-05-20 DIAGNOSIS — H547 Unspecified visual loss: Secondary | ICD-10-CM

## 2023-05-20 DIAGNOSIS — G43111 Migraine with aura, intractable, with status migrainosus: Secondary | ICD-10-CM

## 2023-05-20 MED ORDER — EMGALITY 120 MG/ML ~~LOC~~ SOAJ: 240.0000 mg | Freq: Once | SUBCUTANEOUS | 0 refills | Status: AC

## 2023-05-20 NOTE — Telephone Encounter (Signed)
*  L[facility]   PA request received for Emgality 120MG /ML auto-injectors (migraine)  PA submitted to [facility] Medicaid and has been APPROVED from 05/20/2023-08/18/2023  Key: [id number]

## 2023-05-20 NOTE — Patient Instructions (Signed)
Stop Aimovig.  Change to Emgality  - 2 injections for first dose, then 1 injection every 28 days thereafter.  Once you pick up starting dose (2 pens), send me MyChart message and I will send in the standing order When headache gets severe, use Zavzpret nasal spray (once in 24 hours).  Let me know if it works MRI of brain Keep headache diary Follow up 5 months.

## 2023-05-28 ENCOUNTER — Encounter: Payer: Self-pay | Admitting: Neurology

## 2023-06-01 ENCOUNTER — Ambulatory Visit
Admission: RE | Admit: 2023-06-01 | Discharge: 2023-06-01 | Disposition: A | Discharge status: Home / Self Care | Payer: Medicaid Other | Source: Ambulatory Visit | Attending: Neurology | Admitting: Neurology

## 2023-06-01 DIAGNOSIS — G43111 Migraine with aura, intractable, with status migrainosus: Secondary | ICD-10-CM

## 2023-06-01 DIAGNOSIS — H547 Unspecified visual loss: Secondary | ICD-10-CM

## 2023-06-01 DIAGNOSIS — R200 Anesthesia of skin: Secondary | ICD-10-CM

## 2023-06-02 ENCOUNTER — Encounter: Payer: Self-pay | Admitting: Neurology

## 2023-06-02 ENCOUNTER — Other Ambulatory Visit: Payer: Self-pay | Admitting: Neurology

## 2023-06-02 ENCOUNTER — Telehealth: Payer: Self-pay

## 2023-06-02 MED ORDER — ZAVZPRET 10 MG/ACT NA SOLN: 1.0000 | Freq: Every day | NASAL | 11 refills | Status: DC | PRN

## 2023-06-02 NOTE — Progress Notes (Signed)
LMOVM for the patient to call the office back.  °

## 2023-06-02 NOTE — Telephone Encounter (Signed)
Pa needed for Zavzpret.

## 2023-06-08 ENCOUNTER — Ambulatory Visit: Payer: Medicaid Other | Admitting: Neurology

## 2023-06-15 ENCOUNTER — Telehealth: Payer: Self-pay | Admitting: Pharmacy Technician

## 2023-06-15 NOTE — Telephone Encounter (Signed)
PA request has been Approved. New Encounter created for follow up. For additional info see Pharmacy Prior Auth telephone encounter from 06/15/2023.

## 2023-06-15 NOTE — Telephone Encounter (Signed)
Pharmacy Patient Advocate Encounter  Received notification from [facility] that Prior Authorization for Zavzpret 10MG /ACT solution has been APPROVED from 06/15/2023 to 06/14/2024.Marland Kitchen  PA #/Case ID/Reference #: [id number]   Key: [id number]

## 2023-06-27 ENCOUNTER — Other Ambulatory Visit (HOSPITAL_COMMUNITY): Payer: Self-pay

## 2023-06-27 ENCOUNTER — Telehealth: Payer: Self-pay | Admitting: Pharmacy Technician

## 2023-06-27 NOTE — Telephone Encounter (Signed)
Pharmacy Patient Advocate Encounter  Received notification from [facility] that Prior Authorization for AIMOVIG 140MG  has been APPROVED from 7.20.24 to 7.20.25.Marland Kitchen  PA #/Case ID/Reference #: [id number]  Spoke with Pharmacy to process. Copay is $4   Pharmacy Patient Advocate Encounter   Received notification from CoverMyMeds that prior authorization for AIMOVIG 140MG  is required/requested.   Insurance verification completed.   The patient is insured through [facility] .   Per test claim: PA submitted to [facility] via CoverMyMeds Key/confirmation #/EOC [id number] Status is pending

## 2023-06-29 ENCOUNTER — Other Ambulatory Visit (HOSPITAL_COMMUNITY): Payer: Self-pay

## 2023-07-09 ENCOUNTER — Encounter: Payer: Self-pay | Admitting: Neurology

## 2023-07-24 ENCOUNTER — Telehealth: Payer: Self-pay | Admitting: Pharmacy Technician

## 2023-07-24 ENCOUNTER — Other Ambulatory Visit (HOSPITAL_COMMUNITY): Payer: Self-pay

## 2023-07-24 NOTE — Telephone Encounter (Signed)
Pharmacy Patient Advocate Encounter  Received notification from [facility] that Prior Authorization for [facility] 120MG  has been APPROVED from 8.16.24 to 8.16.25   PA #/Case ID/Reference #: [id number]

## 2023-07-24 NOTE — Telephone Encounter (Signed)
Pharmacy Patient Advocate Encounter   Received notification from CoverMyMeds that prior authorization for [facility] 120MG  is required/requested.   Insurance verification completed.   The patient is insured through [facility] .   Per test claim: PA required; PA submitted to [facility] via CoverMyMeds Key/confirmation #/EOC [id number] Status is pending

## 2023-07-29 ENCOUNTER — Encounter: Payer: Self-pay | Admitting: Neurology

## 2023-07-30 ENCOUNTER — Ambulatory Visit: Payer: Medicaid Other | Admitting: Family Medicine

## 2023-07-30 ENCOUNTER — Other Ambulatory Visit: Payer: Self-pay | Admitting: Neurology

## 2023-07-30 MED ORDER — QULIPTA 60 MG PO TABS: 60.0000 mg | ORAL_TABLET | Freq: Every day | ORAL | 11 refills | Status: DC

## 2023-10-07 ENCOUNTER — Ambulatory Visit: Payer: Medicaid Other | Admitting: Family Medicine

## 2023-10-07 ENCOUNTER — Telehealth: Payer: Self-pay | Admitting: Urgent Care

## 2023-10-07 NOTE — Telephone Encounter (Signed)
Patient came in at 830am statign she thought appointment was at 65 I let her know her appointment was at 8am I let her know she can still be seen but it would be a wait since she's late for her appointment, patient stated she didn't want to wait and didn't want to reschedule and left

## 2023-10-13 ENCOUNTER — Telehealth: Payer: Self-pay | Admitting: Pharmacy Technician

## 2023-10-13 ENCOUNTER — Other Ambulatory Visit (HOSPITAL_COMMUNITY): Payer: Self-pay

## 2023-10-13 NOTE — Progress Notes (Unsigned)
NEUROLOGY FOLLOW UP OFFICE NOTE  Corleen Otwell [id number]  Assessment/Plan:   Migraine with aura, with status migrainosus, intractable Transient vision loss both eyes.  Recurrent problem suggestive of migraine aura, but must rule out other causes Dysesthesias of fingertips of left hand.  Unclear etiology.  Possibly migraine aura in setting of status migrainosus Left amaurosis fugax - likely migraine aura but cannot rule out central retinal artery occlusion in setting of Sickle cell anemia  2 mm ACOM aneurysm   Migraine prevention:  Start Qulipta 60mg  daily  Migraine rescue:  She will try samples of Ubrelvy.  Will also have her retry Zavzpret NS and reviewed how to be administered.  Cannot take triptans. Limit use of pain relievers to no more than 2 days out of week to prevent risk of rebound or medication-overuse headache. Keep headache diary Repeat MRA of head Follow up 3 months     Subjective:  Taila Basinski is a 37 year old left-handed female with sickle cell anemia with history of stroke, asthma and migraines who follows up for migraines and history of stroke.     UPDATE: Due to increased migraines, Aimovig was switched to [employer].  However, she was unable to tolerate the injection site reaction.  Bennie Pierini was finally approved.  Since she hasn't been on any preventative for over a month, migraines have increased Intensity:  starts at 5 and then increases to 8/10 Duration:  all day.  Treating with oxycodone for past 5 days.  Zavzpret worked for the first 2-3 times but then it became ineffective.   Frequency: daily Treats with oxycodone daily.  Takes Advil or Aleve sparingly (once a week)  In June, her fingertips in her left hand felt more sensitive to touching hot objects than it should.  So just touching the hot dryer or water in the shower felt like she was touching the stove top.  No neck pain, radicular pain or weakness of the left upper extremity.  She also had two episodes of  vision loss (blurred vision).  One time, it lasted 20 minutes.  Another time, it lasted 10 minutes.  She had an eye exam that was unremarkable.   On a couple of occasions, her head would shake prior to the migraine.  MRI of brain without contrast on 06/01/2023 personally reviewed was unremarkable.    Current NSAIDS:  Aleve, ASA 81mg  Current analgesics:  Percocet Current triptans:  none Current ergotamine:  none Current anti-emetic:  none Current muscle relaxants:  none Current anti-anxiolytic:  none Current sleep aide:  none Current Antihypertensive medications:  none Current Antidepressant medications:  none Current Anticonvulsant medications: none Current anti-CGRP:  none Current Vitamins/Herbal/Supplements:  none Current Antihistamines/Decongestants:  none Other therapy:  none Hormone/birth control:  none   Caffeine:  1 cup of coffee 10 days a month; 1 cup of tea 10 days a month; drinks a Coke 2 days a month Diet:  6 bottles of water daily Exercise:  Not routine Depression:  Moderate depression; Anxiety:  yes Other pain:  Sickle cell crisis infrequently Sleep hygiene:  Some difficulty falling asleep.  May take 2 hours to fall asleep but will wake up 2 hours later.     HISTORY:  She started having a persistent headache since age 43.  She has had multiple MRIs in the past.  Headaches vary (may be unilateral either side, frontal or back of neck.  It is pounding.  They are typically at 2-3/10 but they may become 8-10/10 usually lasting  1 to 4 hours and occur once a month.  Typical associated symptoms include seeing spots, burning on the head, photophobia, phonophobia, nausea, osmophobia.  No associated vomiting, or unilateral numbness or weakness.  No identifiable trigger.  Vomiting and resting in dark room helps relieve.  She went to the ED on 09/07/2019 because she had a severe left sided migraine associated with complete vision loss of left eye for 20-30 minutes followed by blurred vision  in the left eye and tingling around the eye.  Blurred vision lasted 8 hours.  Unclear if it was migraine arua vs left amaurosis fugax/central retinal artery occlusion.  MRI of brain with and without contrast from 09/07/2019 was personally reviewed and was normal.  CTA of head and neck was performed on 04/13/2020 showed minimal calcified plaque within the bilateral ICA siphons and possible 2 mm Acomm aneurysm but no significant intracranial or extracranial stenosis.  MRA of head on 11/23/2021 personally reviewed revealed stable 2 mm inferiorly projecting outpouching arisking from the anterior communicating artery possibly representing a small aneurysm.   She sometimes has positional vertigo.   Other history:  She reports that in 6th grade, she began having syncopal spells in which she vision blacked out and then lost consciousness.  At the time, she was told they were "mini strokes".  Episodes resolved after about 1 year.   Past NSAIDS:  Ibuprofen, naproxen, flurbiprofen Past analgesics:  Fioricet; oxycodone, BC powder Past abortive triptans:  Maxalt 10mg ; sumatriptan 50mg  Past abortive ergotamine:  none Past muscle relaxants:  Robaxin Past anti-emetic:  Reglan Past antihypertensive medications:  none Past antidepressant medications:  Amitriptyline (ineffective, drowsiness) Past anticonvulsant medications:  Topiramate (ineffective) Past anti-CGRP:  Nurtec, Ubrelvy, Aimovig, Emgality (site reaction) Past vitamins/Herbal/Supplements:  none Past antihistamines/decongestants:  none Other past therapies:  Reyvow.       Family history of headache:  Mom (migraines), 2 sisters (migraines), 3 maternal aunts (migraines)  PAST MEDICAL HISTORY: Past Medical History:  Diagnosis Date   Anal fissure    Asthma    Blood transfusion without reported diagnosis    Dermoid cyst    iguinal area   Eczema    Migraines    Murmur, cardiac    Pneumonia    Sickle cell anemia (HCC)    Stroke [facility])      MEDICATIONS: Current Outpatient Medications on File Prior to Visit  Medication Sig Dispense Refill   Zavegepant HCl (ZAVZPRET) 10 MG/ACT SOLN Place 1 spray into the nose daily as needed. 6 each 11   albuterol (VENTOLIN HFA) 108 (90 Base) MCG/ACT inhaler Inhale 1-2 puffs into the lungs every 6 (six) hours as needed for wheezing or shortness of breath.     Atogepant (QULIPTA) 60 MG TABS Take 1 tablet (60 mg total) by mouth daily. 30 tablet 11   clobetasol cream (TEMOVATE) 0.05 % Apply 1 application topically daily.     dupilumab (DUPIXENT) 300 MG/2ML prefilled syringe      EPINEPHrine 0.3 mg/0.3 mL IJ SOAJ injection Inject 0.3 mg into the muscle daily as needed for anaphylaxis.     EUCRISA 2 % OINT SMARTSIG:sparingly Topical Twice Daily     fluticasone (FLOVENT HFA) 44 MCG/ACT inhaler Inhale 1 puff into the lungs 2 (two) times daily.     folic acid (FOLVITE) 1 MG tablet Take 1 mg by mouth daily.     hydroxypropyl methylcellulose / hypromellose (ISOPTO TEARS / GONIOVISC) 2.5 % ophthalmic solution Place 1 drop into both eyes 3 (three) times daily  as needed for dry eyes.     hydrOXYzine (ATARAX/VISTARIL) 10 MG tablet 1-2 tab po q 8 hrs prn itching 30 tablet 0   Olopatadine HCl 0.2 % SOLN 1 drop.     oxyCODONE-acetaminophen (PERCOCET/ROXICET) 5-325 MG tablet Take 1-2 tablets by mouth every 4 (four) hours as needed for severe pain.      pantoprazole (PROTONIX) 40 MG tablet Take 40 mg by mouth daily.     triamcinolone cream (KENALOG) 0.5 % Apply 1 application topically daily.     No current facility-administered medications on file prior to visit.    ALLERGIES: Allergies  Allergen Reactions   Bee Venom Swelling   Pollen Extract Other (See Comments)    Seasonal allergies     FAMILY HISTORY: Family History  Problem Relation Age of Onset   Diabetes Mother    Sickle cell trait Sister    Sickle cell trait Sister    Depression Sister    Breast cancer Maternal Aunt    Diabetes Maternal  Aunt    Breast cancer Maternal Aunt    Diabetes Maternal Aunt    Colon cancer Neg Hx    Stomach cancer Neg Hx    Pancreatic cancer Neg Hx    Liver cancer Neg Hx    Esophageal cancer Neg Hx       Objective:  Blood pressure 119/72, pulse (!) 55, height 5\' 7"  (1.702 m), weight 179 lb 3.2 oz (81.3 kg), SpO2 98%. General: No acute distress.  Patient appears well-groomed.       Shon Millet, DO  CC: Guy Sandifer, [place]

## 2023-10-13 NOTE — Telephone Encounter (Signed)
Pharmacy Patient Advocate Encounter  Received notification from [facility] that Prior Authorization for Qulipta 60MG  tablets has been APPROVED from 10/13/2023 to 01/11/2024. Ran test claim, Copay is $4.00. This test claim was processed through [facility]- copay amounts may vary at other pharmacies due to pharmacy/plan contracts, or as the patient moves through the different stages of their insurance plan.   PA #/Case ID/Reference #: [id number] Key: [id number]

## 2023-10-14 ENCOUNTER — Ambulatory Visit: Payer: Medicaid Other | Admitting: Neurology

## 2023-10-14 ENCOUNTER — Encounter: Payer: Self-pay | Admitting: Neurology

## 2023-10-14 VITALS — BP 119/72 | HR 55 | Ht 67.0 in | Wt 179.2 lb

## 2023-10-14 DIAGNOSIS — G43111 Migraine with aura, intractable, with status migrainosus: Secondary | ICD-10-CM

## 2023-10-14 DIAGNOSIS — I671 Cerebral aneurysm, nonruptured: Secondary | ICD-10-CM

## 2023-10-14 NOTE — Patient Instructions (Signed)
Start [place]. When you get a migraine, try Ubrelvy.  Take one at earliest onset.  May repeat after 2 hours.  Maximum 2 tablets in 24 hours.   Also, still try Zavzpret as we discussed.  Let me know if you would like prescription for either Limit use of pain relievers to no more than 2 days out of week to prevent risk of rebound or medication-overuse headache. Keep headache diary Follow up 3 months.

## 2023-10-14 NOTE — Addendum Note (Signed)
Addended by: Leida Lauth on: 10/14/2023 11:29 AM   Modules accepted: Orders

## 2023-10-14 NOTE — Progress Notes (Signed)
Medication Samples have been provided to the patient.  Drug name: Bernita Raisin       Strength: 100 mg        Qty: 4  LOT: [id number]  Exp.Date: 12/2024  Dosing instructions: as needed  The patient has been instructed regarding the correct time, dose, and frequency of taking this medication, including desired effects and most common side effects.   Leida Lauth 10:46 AM 10/14/2023     Medication Samples have been provided to the patient.  Drug name: Zavapret        Strength:         Qty: 1  LOT: [id number]  Exp.Date: 12/2024  Dosing instructions: as needed  The patient has been instructed regarding the correct time, dose, and frequency of taking this medication, including desired effects and most common side effects.   Leida Lauth 10:46 AM 10/14/2023

## 2023-11-03 ENCOUNTER — Ambulatory Visit: Payer: Medicaid Other | Admitting: Neurology

## 2023-11-11 ENCOUNTER — Other Ambulatory Visit: Payer: Self-pay | Admitting: Neurology

## 2023-12-11 ENCOUNTER — Telehealth: Payer: Self-pay | Admitting: Neurology

## 2023-12-11 NOTE — Telephone Encounter (Signed)
 MRI of brain shows nothing concerning.  I would like to check MRA of head to follow up on aneurysm

## 2023-12-11 NOTE — Telephone Encounter (Signed)
 Patient advised.

## 2023-12-23 ENCOUNTER — Encounter: Payer: Self-pay | Admitting: Neurology

## 2023-12-24 ENCOUNTER — Other Ambulatory Visit: Payer: Self-pay | Admitting: Neurology

## 2023-12-24 MED ORDER — UBRELVY 100 MG PO TABS: 1.0000 | ORAL_TABLET | ORAL | 5 refills | Status: DC | PRN

## 2024-01-06 ENCOUNTER — Other Ambulatory Visit (HOSPITAL_COMMUNITY): Payer: Self-pay

## 2024-01-06 ENCOUNTER — Telehealth: Payer: Self-pay | Admitting: Pharmacy Technician

## 2024-01-06 NOTE — Telephone Encounter (Signed)
Pharmacy Patient Advocate Encounter  Received notification from [facility] that Prior Authorization for QULIPTA 60MG  has been APPROVED from 1.29.25 to 1.29.26. Unable to obtain price due to refill too soon rejection, last fill date 1.13.25 next available fill date2.5.25   PA #/Case ID/Reference #:  [id number]

## 2024-01-06 NOTE — Telephone Encounter (Signed)
Pharmacy Patient Advocate Encounter   Received notification from CoverMyMeds that prior authorization for QULIPTA 60MG  is required/requested.   Insurance verification completed.   The patient is insured through [facility] .   Per test claim: PA required; PA submitted to above mentioned insurance via CoverMyMeds Key/confirmation #/EOC [id number] Status is pending

## 2024-01-11 ENCOUNTER — Telehealth: Payer: Self-pay

## 2024-01-11 ENCOUNTER — Other Ambulatory Visit (HOSPITAL_COMMUNITY): Payer: Self-pay

## 2024-01-11 NOTE — Telephone Encounter (Signed)
PA request has been Submitted. New Encounter created for follow up. For additional info see Pharmacy Prior Auth telephone encounter from 02/03.

## 2024-01-11 NOTE — Telephone Encounter (Signed)
*  [facility]  Pharmacy Patient Advocate Encounter   Received notification from Patient Advice Request messages that prior authorization for Ubrelvy 100MG  tablets  is required/requested.   Insurance verification completed.   The patient is insured through [facility] .   Per test claim: PA required; PA submitted to above mentioned insurance via CoverMyMeds Key/confirmation #/EOC [id number] Status is pending

## 2024-01-12 NOTE — Telephone Encounter (Signed)
 Pharmacy Patient Advocate Encounter  Received notification from [facility] that Prior Authorization for UBRELVY  100MG  has been DENIED.  Full denial letter will be uploaded to the media tab. See denial reason below.   PA #/Case ID/Reference #:  [id number]

## 2024-01-13 NOTE — Progress Notes (Signed)
 NEUROLOGY FOLLOW UP OFFICE NOTE  Shannon Roy [id number]  Assessment/Plan:   Migraine with aura, with status migrainosus, intractable Transient vision loss both eyes.  Recurrent problem suggestive of migraine aura, but must rule out other causes Dysesthesias of fingertips of left hand.  Unclear etiology.  Possibly migraine aura in setting of status migrainosus Left amaurosis fugax - likely migraine aura but cannot rule out central retinal artery occlusion in setting of Sickle cell anemia  2 mm ACOM aneurysm   Migraines are significantly improved on Qulipta , now down to 4 a month from daily Migraine prevention:  Qulipta  60mg  daily  Migraine rescue:  Ubrelvy  100mg  - resubmit for prior authorization - patient has had significant reduction in migraine frequency on Qulipta , no longer chronic Limit use of pain relievers to no more than 2 days out of week to prevent risk of rebound or medication-overuse headache. Keep headache diary Repeat MRA of head Follow up 6 months.     Subjective:  Shannon Roy is a 38 year old left-handed female with sickle cell anemia with history of stroke, asthma and migraines who follows up for migraines and history of stroke.     UPDATE: Started Qulipta .   Intensity:  dull headaches 2/10, migraines starts at 5 and then increases to 8/10  Duration:  2 hours with Ubrelvy  Frequency: total 10 to 13 headache days a month (1 or 2 a month are moderate-severe, otherwise 2/10)   MRI Brain without contrast on 12/07/2023 revealed few nonspecific foci in the right frontal white matter within normal limits but nothing concerning. MRI of C-spine revealed posterior disc osteophyte complex at C5-6 minimally deforming the ventral cord without substantial canal stenosis or cord signal abnormality.  Has not received call to schedule MRA  Current NSAIDS:  Aleve, ASA 81mg  Current analgesics:  Percocet Current triptans:  none Current ergotamine:  none Current anti-emetic:   none Current muscle relaxants:  none Current anti-anxiolytic:  none Current sleep aide:  none Current Antihypertensive medications:  none Current Antidepressant medications:  none Current Anticonvulsant medications: none Current anti-CGRP:  Qulipta  60mg  daily Current Vitamins/Herbal/Supplements:  none Current Antihistamines/Decongestants:  none Other therapy:  none Hormone/birth control:  none   Caffeine:  1 cup of coffee 10 days a month; 1 cup of tea 10 days a month; drinks a Coke 2 days a month Diet:  6 bottles of water daily Exercise:  Not routine Depression:  Moderate depression; Anxiety:  yes Other pain:  Sickle cell crisis infrequently Sleep hygiene:  Some difficulty falling asleep.  May take 2 hours to fall asleep but will wake up 2 hours later.     HISTORY:  She started having a persistent headache since age 17.  She has had multiple MRIs in the past.  Headaches vary (may be unilateral either side, frontal or back of neck.  It is pounding.  They are typically at 2-3/10 but they may become 8-10/10 usually lasting 1 to 4 hours and occur once a month.  Typical associated symptoms include seeing spots, burning on the head, photophobia, phonophobia, nausea, osmophobia.  No associated vomiting, or unilateral numbness or weakness.  No identifiable trigger.  Vomiting and resting in dark room helps relieve.  She went to the ED on 09/07/2019 because she had a severe left sided migraine associated with complete vision loss of left eye for 20-30 minutes followed by blurred vision in the left eye and tingling around the eye.  Blurred vision lasted 8 hours.  Unclear if it was  migraine arua vs left amaurosis fugax/central retinal artery occlusion.  MRI of brain with and without contrast from 09/07/2019 was personally reviewed and was normal.  CTA of head and neck was performed on 04/13/2020 showed minimal calcified plaque within the bilateral ICA siphons and possible 2 mm Acomm aneurysm but no significant  intracranial or extracranial stenosis.  MRA of head on 11/23/2021 personally reviewed revealed stable 2 mm inferiorly projecting outpouching arisking from the anterior communicating artery possibly representing a small aneurysm.  Other History: In June 2024, her fingertips in her left hand felt more sensitive to touching hot objects than it should.  So just touching the hot dryer or water in the shower felt like she was touching the stove top.  No neck pain, radicular pain or weakness of the left upper extremity.  She also had two episodes of vision loss (blurred vision).  One time, it lasted 20 minutes.  Another time, it lasted 10 minutes.  She had an eye exam that was unremarkable.   On a couple of occasions, her head would shake prior to the migraine.  MRI of brain without contrast on 06/01/2023  was unremarkable.   She sometimes has positional vertigo. She reports that in 6th grade, she began having syncopal spells in which she vision blacked out and then lost consciousness.  At the time, she was told they were mini strokes.  Episodes resolved after about 1 year.   Past NSAIDS:  Ibuprofen, naproxen, flurbiprofen Past analgesics:  Fioricet; oxycodone, BC powder Past abortive triptans:  Maxalt 10mg ; sumatriptan  50mg  Past abortive ergotamine:  none Past muscle relaxants:  Robaxin Past anti-emetic:  Reglan  Past antihypertensive medications:  none Past antidepressant medications:  Amitriptyline (ineffective, drowsiness) Past anticonvulsant medications:  Topiramate (ineffective) Past anti-CGRP:  Nurtec, Zavzpret , Aimovig , Emgality  (site reaction) Past vitamins/Herbal/Supplements:  none Past antihistamines/decongestants:  none Other past therapies:  Reyvow .       Family history of headache:  Mom (migraines), 2 sisters (migraines), 3 maternal aunts (migraines)  PAST MEDICAL HISTORY: Past Medical History:  Diagnosis Date   Anal fissure    Asthma    Blood transfusion without reported  diagnosis    Dermoid cyst    iguinal area   Eczema    Migraines    Murmur, cardiac    Pneumonia    Sickle cell anemia (HCC)    Stroke [facility])     MEDICATIONS: Current Outpatient Medications on File Prior to Visit  Medication Sig Dispense Refill   albuterol  (VENTOLIN  HFA) 108 (90 Base) MCG/ACT inhaler Inhale 1-2 puffs into the lungs every 6 (six) hours as needed for wheezing or shortness of breath. (Patient not taking: Reported on 10/14/2023)     Atogepant  (QULIPTA ) 60 MG TABS Take 1 tablet (60 mg total) by mouth daily. 30 tablet 11   clobetasol cream (TEMOVATE) 0.05 % Apply 1 application topically daily.     dupilumab (DUPIXENT) 300 MG/2ML prefilled syringe  (Patient not taking: Reported on 10/14/2023)     EPINEPHrine  0.3 mg/0.3 mL IJ SOAJ injection Inject 0.3 mg into the muscle daily as needed for anaphylaxis.     EUCRISA 2 % OINT SMARTSIG:sparingly Topical Twice Daily     fluticasone  (FLOVENT  HFA) 44 MCG/ACT inhaler Inhale 1 puff into the lungs 2 (two) times daily.     folic acid (FOLVITE) 1 MG tablet Take 1 mg by mouth daily.     hydroxypropyl methylcellulose / hypromellose (ISOPTO TEARS / GONIOVISC) 2.5 % ophthalmic solution Place 1 drop into both  eyes 3 (three) times daily as needed for dry eyes.     hydrOXYzine  (ATARAX /VISTARIL ) 10 MG tablet 1-2 tab po q 8 hrs prn itching 30 tablet 0   Olopatadine HCl 0.2 % SOLN 1 drop.     oxyCODONE-acetaminophen  (PERCOCET/ROXICET) 5-325 MG tablet Take 1-2 tablets by mouth every 4 (four) hours as needed for severe pain.      pantoprazole (PROTONIX) 40 MG tablet Take 40 mg by mouth daily. (Patient not taking: Reported on 10/14/2023)     triamcinolone cream (KENALOG) 0.5 % Apply 1 application topically daily.     Ubrogepant  (UBRELVY ) 100 MG TABS Take 1 tablet (100 mg total) by mouth as needed. May repeat after 2 hours.  Maximum 2 tablets in 24 hours. 10 tablet 5   Zavegepant HCl (ZAVZPRET ) 10 MG/ACT SOLN Place 1 spray into the nose daily as needed.  (Patient not taking: Reported on 10/14/2023) 6 each 11   No current facility-administered medications on file prior to visit.    ALLERGIES: Allergies  Allergen Reactions   Bee Venom Swelling   Pollen Extract Other (See Comments)    Seasonal allergies     FAMILY HISTORY: Family History  Problem Relation Age of Onset   Diabetes Mother    Sickle cell trait Sister    Sickle cell trait Sister    Depression Sister    Breast cancer Maternal Aunt    Diabetes Maternal Aunt    Breast cancer Maternal Aunt    Diabetes Maternal Aunt    Colon cancer Neg Hx    Stomach cancer Neg Hx    Pancreatic cancer Neg Hx    Liver cancer Neg Hx    Esophageal cancer Neg Hx       Objective:  Blood pressure 129/80, pulse 81, height 5' 7 (1.702 m), weight 178 lb (80.7 kg), SpO2 94%.  General: No acute distress.  Patient appears well-groomed.   Head:  Normocephalic/atraumatic Neck:  Supple.  No paraspinal tenderness.  Full range of motion. Heart:  Regular rate and rhythm. Neuro:  Alert and oriented.  Speech fluent and not dysarthric.  Language intact.  CN II-XII intact.  Bulk and tone normal.  Muscle strength 5/5 throughout.  Deep tendon reflexes 2+ throughout.  Gait normal.  Romberg negative.     Shannon Dunnings, DO  CC: Benton Gave, [place]

## 2024-01-14 ENCOUNTER — Ambulatory Visit: Payer: Medicaid Other | Admitting: Neurology

## 2024-01-14 ENCOUNTER — Encounter: Payer: Self-pay | Admitting: Neurology

## 2024-01-14 VITALS — BP 129/80 | HR 81 | Ht 67.0 in | Wt 178.0 lb

## 2024-01-14 DIAGNOSIS — G43009 Migraine without aura, not intractable, without status migrainosus: Secondary | ICD-10-CM

## 2024-01-14 DIAGNOSIS — I671 Cerebral aneurysm, nonruptured: Secondary | ICD-10-CM

## 2024-01-14 MED ORDER — UBRELVY 100 MG PO TABS: 1.0000 | ORAL_TABLET | ORAL | 5 refills | Status: AC | PRN

## 2024-01-14 MED ORDER — QULIPTA 60 MG PO TABS: 60.0000 mg | ORAL_TABLET | Freq: Every day | ORAL | 11 refills | Status: DC

## 2024-01-14 NOTE — Patient Instructions (Signed)
 Qulipta  60mg  daily Ubrelvy  as needed MRA of head Follow up 6 months.

## 2024-01-19 ENCOUNTER — Telehealth: Payer: Self-pay

## 2024-01-19 NOTE — Telephone Encounter (Signed)
With new ov noes, See if Bernita Raisin can be approved.   PA needed for [employer]

## 2024-01-27 ENCOUNTER — Other Ambulatory Visit: Payer: Self-pay | Admitting: Neurology

## 2024-02-10 DIAGNOSIS — I517 Cardiomegaly: Secondary | ICD-10-CM | POA: Insufficient documentation

## 2024-02-10 DIAGNOSIS — R002 Palpitations: Secondary | ICD-10-CM | POA: Insufficient documentation

## 2024-02-10 DIAGNOSIS — R0609 Other forms of dyspnea: Secondary | ICD-10-CM | POA: Insufficient documentation

## 2024-02-16 ENCOUNTER — Telehealth: Payer: Self-pay | Admitting: Pharmacist

## 2024-02-16 ENCOUNTER — Other Ambulatory Visit (HOSPITAL_COMMUNITY): Payer: Self-pay

## 2024-02-16 NOTE — Telephone Encounter (Signed)
 Pa status

## 2024-02-16 NOTE — Telephone Encounter (Signed)
 Pharmacy Patient Advocate Encounter  Received notification from [facility] that Prior Authorization for Ubrelvy 100MG  tablets has been APPROVED from 02/16/2024 to 02/15/2025   PA #/Case ID/Reference #: [id number]

## 2024-02-18 ENCOUNTER — Other Ambulatory Visit: Payer: Self-pay

## 2024-02-18 ENCOUNTER — Encounter (HOSPITAL_COMMUNITY): Payer: Self-pay

## 2024-02-18 ENCOUNTER — Emergency Department (HOSPITAL_COMMUNITY)
Admission: EM | Admit: 2024-02-18 | Discharge: 2024-02-18 | Discharge status: Against Medical Advice | Attending: Emergency Medicine | Admitting: Emergency Medicine

## 2024-02-18 DIAGNOSIS — Z5321 Procedure and treatment not carried out due to patient leaving prior to being seen by health care provider: Secondary | ICD-10-CM | POA: Insufficient documentation

## 2024-02-18 DIAGNOSIS — R5383 Other fatigue: Secondary | ICD-10-CM | POA: Insufficient documentation

## 2024-02-18 DIAGNOSIS — R0602 Shortness of breath: Secondary | ICD-10-CM | POA: Diagnosis not present

## 2024-02-18 DIAGNOSIS — M545 Low back pain, unspecified: Secondary | ICD-10-CM | POA: Insufficient documentation

## 2024-02-18 DIAGNOSIS — R519 Headache, unspecified: Secondary | ICD-10-CM | POA: Insufficient documentation

## 2024-02-18 MED ORDER — HYDROMORPHONE HCL 1 MG/ML IJ SOLN: 1.0000 mg | Freq: Once | INTRAMUSCULAR | Status: DC

## 2024-02-18 NOTE — ED Notes (Signed)
 The patient states she is tired of waiting and will go somewhere else. The patient was informed that the PA would be coming to see her, that orders were already in for labs work. She asked how much longer until the PA will be to see her because she is in a lot of pain. I informed her  I couldn't give her an exact time but that the PA was in another room. She then stated she was leaving. Pt A&OX4, ambulatory with independent steady gait.

## 2024-02-18 NOTE — ED Triage Notes (Signed)
 Pt reports lower abd pain that radiates into her back, also reports headache, fatigue, shortness of breath, states 'I feel like my whole body hurts" and also reports pain to urination and bowel movements. LBM yesterday. All symptoms started yesterday around 0800. Hx of sickle cell and is unsure if this is a crisis.

## 2024-02-22 ENCOUNTER — Ambulatory Visit: Admitting: Family Medicine

## 2024-02-22 ENCOUNTER — Encounter: Payer: Self-pay | Admitting: Family Medicine

## 2024-02-22 VITALS — BP 111/69 | HR 90 | Ht 67.0 in | Wt 177.4 lb

## 2024-02-22 DIAGNOSIS — R5383 Other fatigue: Secondary | ICD-10-CM

## 2024-02-22 DIAGNOSIS — D5703 Hb-ss disease with cerebral vascular involvement: Secondary | ICD-10-CM

## 2024-02-22 DIAGNOSIS — E782 Mixed hyperlipidemia: Secondary | ICD-10-CM

## 2024-02-22 DIAGNOSIS — F419 Anxiety disorder, unspecified: Secondary | ICD-10-CM

## 2024-02-22 DIAGNOSIS — J45990 Exercise induced bronchospasm: Secondary | ICD-10-CM | POA: Insufficient documentation

## 2024-02-22 DIAGNOSIS — J4520 Mild intermittent asthma, uncomplicated: Secondary | ICD-10-CM

## 2024-02-22 DIAGNOSIS — D638 Anemia in other chronic diseases classified elsewhere: Secondary | ICD-10-CM

## 2024-02-22 DIAGNOSIS — L309 Dermatitis, unspecified: Secondary | ICD-10-CM | POA: Insufficient documentation

## 2024-02-22 DIAGNOSIS — J45909 Unspecified asthma, uncomplicated: Secondary | ICD-10-CM | POA: Insufficient documentation

## 2024-02-22 NOTE — Progress Notes (Unsigned)
 New Patient Office Visit  Subjective    Patient ID: Shannon Roy, female    DOB: 16-Jul-1986  Age: 38 y.o. MRN: [id number]  CC:  Chief Complaint  Patient presents with  . Establish Care    Establish Care, Dr Guy Sandifer- [facility]. Keeping Headaches-sees nuero Dr .. Lft leg and big Toe Tingling (comes and goes) Needs refills     HPI Shannon Roy presents to establish care   Outpatient Encounter Medications as of 02/22/2024  Medication Sig  . albuterol (VENTOLIN HFA) 108 (90 Base) MCG/ACT inhaler Inhale 1-2 puffs into the lungs every 6 (six) hours as needed for wheezing or shortness of breath.  . clobetasol cream (TEMOVATE) 0.05 % Apply 1 application topically daily.  Marland Kitchen EPINEPHrine 0.3 mg/0.3 mL IJ SOAJ injection Inject 0.3 mg into the muscle daily as needed for anaphylaxis.  Marland Kitchen EUCRISA 2 % OINT SMARTSIG:sparingly Topical Twice Daily  . fluticasone (FLOVENT HFA) 44 MCG/ACT inhaler Inhale 1 puff into the lungs 2 (two) times daily.  . folic acid (FOLVITE) 1 MG tablet Take 1 mg by mouth daily.  . hydroxypropyl methylcellulose / hypromellose (ISOPTO TEARS / GONIOVISC) 2.5 % ophthalmic solution Place 1 drop into both eyes 3 (three) times daily as needed for dry eyes.  . hydrOXYzine (ATARAX/VISTARIL) 10 MG tablet 1-2 tab po q 8 hrs prn itching  . Olopatadine HCl 0.2 % SOLN 1 drop.  Marland Kitchen oxyCODONE-acetaminophen (PERCOCET/ROXICET) 5-325 MG tablet Take 1-2 tablets by mouth every 4 (four) hours as needed for severe pain.   . pantoprazole (PROTONIX) 40 MG tablet Take 40 mg by mouth daily.  Marland Kitchen triamcinolone cream (KENALOG) 0.5 % Apply 1 application topically daily.  Marland Kitchen Ubrogepant (UBRELVY) 100 MG TABS Take 1 tablet (100 mg total) by mouth as needed. May repeat after 2 hours.  Maximum 2 tablets in 24 hours.  . [DISCONTINUED] Atogepant (QULIPTA) 60 MG TABS Take 1 tablet (60 mg total) by mouth daily.  . [DISCONTINUED] dupilumab (DUPIXENT) 300 MG/2ML prefilled syringe    No  facility-administered encounter medications on file as of 02/22/2024.    Past Medical History:  Diagnosis Date  . Anal fissure   . Asthma   . Blood transfusion without reported diagnosis   . Dermoid cyst    iguinal area  . Eczema   . Migraines   . Murmur, cardiac   . Pneumonia   . Sickle cell anemia (HCC)   . Stroke [facility])     Past Surgical History:  Procedure Laterality Date  . ANKLE SURGERY Right 2002   broken after a fall  . anklesurgery Right 2015   to have hardware removed  . CHOLECYSTECTOMY    . DERMOID CYST  EXCISION Bilateral    2018, removed from inguinal area  . TONSILLECTOMY      Family History  Problem Relation Age of Onset  . Diabetes Mother   . Sickle cell trait Sister   . Sickle cell trait Sister   . Depression Sister   . Breast cancer Maternal Aunt   . Diabetes Maternal Aunt   . Breast cancer Maternal Aunt   . Diabetes Maternal Aunt   . Colon cancer Neg Hx   . Stomach cancer Neg Hx   . Pancreatic cancer Neg Hx   . Liver cancer Neg Hx   . Esophageal cancer Neg Hx     Social History   Socioeconomic History  . Marital status: Single    Spouse name: Not on file  .  Number of children: 0  . Years of education: Not on file  . Highest education level: Not on file  Occupational History  . 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 Smoking status: Never    Passive exposure: Never  . Smokeless tobacco: Never  Vaping Use  . Vaping status: Never Used  Substance and Sexual Activity  . Alcohol use: Not Currently  . Drug use: Not Currently  . Sexual activity: Not Currently  Other Topics Concern  . Not on file  Social History Narrative   No children   [employer] as a Scientist, product/process development   Lives with her youngest sister   Cat and a dog   Enjoys hiking, photography, restaurants   Left handed   Two story home   Social Drivers of Health   Financial Resource Strain: Low Risk  (09/20/2019)   Overall Financial Resource Strain (CARDIA)   .  Difficulty of Paying Living Expenses: Not hard at all  Food Insecurity: No Food Insecurity (09/20/2019)   Hunger Vital Sign   . Worried About Programme researcher, broadcasting/film/video in the Last Year: Never true   . Ran Out of Food in the Last Year: Never true  Transportation Needs: No Transportation Needs (09/20/2019)   PRAPARE - Transportation   . Lack of Transportation (Medical): No   . Lack of Transportation (Non-Medical): No  Physical Activity: Sufficiently Active (09/20/2019)   Exercise Vital Sign   . Days of Exercise per Week: 3 days   . Minutes of Exercise per Session: 60 min  Stress: Not on file  Social Connections: Unknown (04/10/2022)   Received from [facility], [facility]   Social Network   . Social Network: Not on file  Intimate Partner Violence: Unknown (03/12/2022)   [facility] from [facility], [facility]   HITS   . Physically Hurt: Not on file   . Insult or Talk Down To: Not on file   . Threaten Physical Harm: Not on file   . Scream or Curse: Not on file    ROS      Objective   BP 111/69 (BP Location: Left Arm, Patient Position: Sitting, Cuff Size: Normal)   Pulse 90   Ht 5\' 7"  (1.702 m)   Wt 177 lb 6.4 oz (80.5 kg)   LMP 02/10/2024   SpO2 90%   BMI 27.78 kg/m  {Vitals History (Optional):23777}  Physical Exam Vitals and nursing note reviewed.  Constitutional:      Appearance: Normal appearance.  HENT:     Head: Normocephalic and atraumatic.  Eyes:     Conjunctiva/sclera: Conjunctivae normal.  Cardiovascular:     Rate and Rhythm: Normal rate and regular rhythm.  Pulmonary:     Effort: Pulmonary effort is normal.     Breath sounds: Normal breath sounds.  Musculoskeletal:     Right lower leg: No edema.     Left lower leg: No edema.  Skin:    General: Skin is warm and dry.  Neurological:     Mental Status: She is alert and oriented to person, place, and time.  Psychiatric:        Mood and Affect: Mood normal.        Behavior: Behavior normal.         Thought Content: Thought content normal.        Judgment: Judgment normal.     {Perform Simple Foot Exam  Perform Detailed exam:1} {Insert foot Exam (Optional):30965}   {Labs (Optional):23779}  The  ASCVD Risk score (Arnett DK, et al., 2019) failed to calculate for the following reasons:   The 2019 ASCVD risk score is only valid for ages 28 to 47   Risk score cannot be calculated because patient has a medical history suggesting prior/existing ASCVD     Assessment & Plan:  There are no diagnoses linked to this encounter.  No follow-ups on file.   Alease Medina, MD

## 2024-02-22 NOTE — Telephone Encounter (Signed)
 PA request has been Approved. New Encounter has been or will be created for follow up. For additional info see Pharmacy Prior Auth telephone encounter from 02/16/2024.

## 2024-02-24 ENCOUNTER — Encounter: Payer: Self-pay | Admitting: Family Medicine

## 2024-02-25 DIAGNOSIS — F419 Anxiety disorder, unspecified: Secondary | ICD-10-CM | POA: Insufficient documentation

## 2024-02-25 MED ORDER — HYDROXYZINE HCL 10 MG PO TABS: ORAL_TABLET | ORAL | 5 refills | Status: AC

## 2024-02-25 MED ORDER — FLUTICASONE PROPIONATE HFA 44 MCG/ACT IN AERO: 1.0000 | INHALATION_SPRAY | Freq: Two times a day (BID) | RESPIRATORY_TRACT | 12 refills | Status: AC

## 2024-02-25 MED ORDER — ALBUTEROL SULFATE HFA 108 (90 BASE) MCG/ACT IN AERS
1.0000 | INHALATION_SPRAY | Freq: Four times a day (QID) | RESPIRATORY_TRACT | 3 refills | Status: AC | PRN
Start: 2024-02-25 — End: ?

## 2024-02-25 MED ORDER — EPINEPHRINE 0.3 MG/0.3ML IJ SOAJ: 0.3000 mg | Freq: Every day | INTRAMUSCULAR | 12 refills | Status: AC | PRN

## 2024-02-25 NOTE — Assessment & Plan Note (Signed)
 Has never used anything but albuterol for her asthma.  Never had hospitalization for asthma.

## 2024-02-25 NOTE — Assessment & Plan Note (Signed)
 Needs a refill of hydroxyzine that she takes as needed for anxiety.

## 2024-02-25 NOTE — Telephone Encounter (Signed)
 Requested medication has been pended. Alprazolam is not active on patient's med list. Please advise.

## 2024-03-23 ENCOUNTER — Encounter: Payer: Self-pay | Admitting: Neurology

## 2024-05-20 ENCOUNTER — Ambulatory Visit: Admitting: Family Medicine

## 2024-07-20 NOTE — Progress Notes (Signed)
 NEUROLOGY FOLLOW UP OFFICE NOTE  Shannon Roy [id number]  Assessment/Plan:   Migraine with aura, with status migrainosus, intractable Chronic daily headache - possibly related to poor sleep hygiene Transient vision loss both eyes.  Recurrent problem suggestive of migraine aura, but must rule out other causes Dysesthesias of fingertips of left hand.  Unclear etiology.  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  Defer for now. Limit use of pain relievers to no more than 9 days out of the month to prevent risk of rebound or medication-overuse headache. Keep headache diary Repeat MRA of head in 5 years.   Follow up 8 months.     Subjective:  Shannon Roy is a 38 year old left-handed female with sickle cell anemia with history of stroke, asthma and migraines who follows up for migraines and history of stroke.     UPDATE: MRA of brain on 03/07/2024 showed stable 2 mm inferiorly directed aneurysm versus infundibulum arising from the anterior communicating artery but no new changes.  Doing well on Qulipta . Migraines are infrequent. Intensity:  5/10 and then increases to 8/10  Duration:  2 hours with Ubrelvy  Frequency:  1-2 in last 8 months  She still continues to have a daily low-grade 2-3/10 headache.  Wakes up in morning with them.  Does not typically treat them (cut out OTC analgesics).  Subsides by noon but returns in the evening.  Sleep is poor.  Difficulty falling asleep and will wake up about 4 hours after falling asleep.  Will typically get up and wash her face,  walk around.  Drinks 1 cup of coffee and 2 cups of tea about every other day.     Current NSAIDS:  ASA 81mg  Current analgesics:  Percocet (rarely takes for severe pain) Current triptans:  none Current ergotamine:  none Current anti-emetic:  none Current muscle relaxants:  none Current anti-anxiolytic:  none Current sleep aide:  none Current Antihypertensive medications:  none Current Antidepressant medications:  none Current Anticonvulsant medications: none Current anti-CGRP:  Qulipta  60mg  daily, Ubrelvy  100mg   Current Vitamins/Herbal/Supplements:  folic acid Current Antihistamines/Decongestants:  none Other therapy:  none Hormone/birth control:  none Other medications:  hydroxyzine    Caffeine:  1 cup of coffee and 2 cups of tea every other day. Diet:  6 bottles of water daily Exercise:  routine Depression:  Moderate depression; Anxiety:  yes Other pain:  Sickle cell crisis infrequently Sleep hygiene:  Some difficulty falling asleep.  May take 2 hours to fall asleep but will wake up 4 hours later.  Had sleep study which was negative for sleep apnea but diagnosed with insomnia.     HISTORY:  She started having a persistent headache since age 56.  She has had multiple MRIs in the past.  Headaches vary (may be unilateral either side, frontal or back of neck.  It is pounding.  They are typically at 2-3/10 but they may become 8-10/10 usually lasting 1 to 4 hours and occur once a month.  Typical associated symptoms include seeing spots, burning on the head, photophobia, phonophobia, nausea, osmophobia.  No associated vomiting, or  unilateral numbness or weakness.  No identifiable trigger.  Vomiting and resting in dark room helps relieve.  She went to the ED on 09/07/2019 because she had a severe left sided migraine associated with complete vision loss of left eye for 20-30 minutes followed by blurred vision in the left eye and tingling around the eye.  Blurred vision lasted 8 hours.  Unclear if  it was migraine arua vs left amaurosis fugax/central retinal artery occlusion.  MRI of brain with and without contrast from 09/07/2019 was personally reviewed and was normal.  CTA of head and neck was performed on 04/13/2020 showed minimal calcified plaque within the bilateral ICA siphons and possible 2 mm Acomm aneurysm but no significant intracranial or extracranial stenosis.  MRA of head on 11/23/2021 personally reviewed revealed stable 2 mm inferiorly projecting outpouching arisking from the anterior communicating artery possibly representing a small aneurysm.   Other History: In June 2024, her fingertips in her left hand felt more sensitive to touching hot objects than it should.  So just touching the hot dryer or water in the shower felt like she was touching the stove top.  No neck pain, radicular pain or weakness of the left upper extremity.  She also had two episodes of vision loss (blurred vision).  One time, it lasted 20 minutes.  Another time, it lasted 10 minutes.  She had an eye exam that was unremarkable.   On a couple of occasions, her head would shake prior to the migraine.  MRI of brain without contrast on 06/01/2023  was unremarkable.   MRI Brain without contrast on 12/07/2023 revealed few nonspecific foci in the right frontal white matter within normal limits but nothing concerning. MRI of C-spine revealed posterior disc osteophyte complex at C5-6 minimally deforming the ventral cord without substantial canal stenosis or cord signal abnormality.   She sometimes has positional vertigo. She reports that in 6th grade, she began having syncopal spells in which she vision blacked out and then lost consciousness.  At the time, she was told they were mini strokes.  Episodes resolved after about 1 year.   Past NSAIDS:  Ibuprofen, naproxen, flurbiprofen Past analgesics:  Fioricet; oxycodone, BC powder Past abortive triptans:  Maxalt 10mg ; sumatriptan  50mg  Past abortive ergotamine:  none Past  muscle relaxants:  Robaxin Past anti-emetic:  Reglan  Past antihypertensive medications:  none Past antidepressant medications:  Amitriptyline (ineffective, drowsiness) Past anticonvulsant medications:  Topiramate (ineffective) Past anti-CGRP:  Nurtec, Zavzpret , Aimovig , Emgality  (site reaction) Past vitamins/Herbal/Supplements:  none Past antihistamines/decongestants:  none Other past therapies:  Reyvow .       Family history of headache:  Mom (migraines), 2 sisters (migraines), 3 maternal aunts (migraines)  PAST MEDICAL HISTORY: Past Medical History:  Diagnosis Date   Anal fissure    Asthma    Blood transfusion without reported diagnosis    Dermoid cyst    iguinal area   Eczema    Migraines    Murmur, cardiac    Pneumonia    Sickle cell anemia (HCC)    Stroke [facility])     MEDICATIONS: Current Outpatient Medications on File Prior to Visit  Medication Sig Dispense Refill   albuterol  (VENTOLIN  HFA) 108 (90 Base) MCG/ACT inhaler Inhale 1-2 puffs into the lungs every 6 (six) hours as needed for wheezing or shortness of breath. 18 g 3   clobetasol cream (TEMOVATE) 0.05 % Apply 1 application topically daily.     EPINEPHrine  0.3 mg/0.3 mL IJ SOAJ injection Inject 0.3 mg into the  muscle daily as needed for anaphylaxis. 1 each 12   EUCRISA 2 % OINT SMARTSIG:sparingly Topical Twice Daily     fluticasone  (FLOVENT  HFA) 44 MCG/ACT inhaler Inhale 1 puff into the lungs 2 (two) times daily. 1 each 12   folic acid (FOLVITE) 1 MG tablet Take 1 mg by mouth daily.     hydrOXYzine  (ATARAX ) 10 MG tablet 1-2 tab po q 8 hrs prn itching 30 tablet 5   Olopatadine HCl 0.2 % SOLN 1 drop.     oxyCODONE-acetaminophen  (PERCOCET/ROXICET) 5-325 MG tablet Take 1-2 tablets by mouth every 4 (four) hours as needed for severe pain.      pantoprazole (PROTONIX) 40 MG tablet Take 40 mg by mouth daily.     triamcinolone cream (KENALOG) 0.5 % Apply 1 application topically daily.     Ubrogepant  (UBRELVY ) 100 MG TABS  Take 1 tablet (100 mg total) by mouth as needed. May repeat after 2 hours.  Maximum 2 tablets in 24 hours. 10 tablet 5   No current facility-administered medications on file prior to visit.     ALLERGIES: Allergies  Allergen Reactions   Bee Venom Swelling   Pollen Extract Other (See Comments)    Seasonal allergies     FAMILY HISTORY: Family History  Problem Relation Age of Onset   Diabetes Mother    Sickle cell trait Sister    Sickle cell trait Sister    Depression Sister    Breast cancer Maternal Aunt    Diabetes Maternal Aunt    Breast cancer Maternal Aunt    Diabetes Maternal Aunt    Colon cancer Neg Hx    Stomach cancer Neg Hx    Pancreatic cancer Neg Hx    Liver cancer Neg Hx    Esophageal cancer Neg Hx       Objective:  Blood pressure 117/74, pulse 89, height 5' 7 (1.702 m), weight 182 lb (82.6 kg), SpO2 99%. General: No acute distress.  Patient appears well-groomed.       Juliene Dunnings, DO  CC: Susan Ziglar, MD

## 2024-07-21 ENCOUNTER — Encounter: Payer: Self-pay | Admitting: Neurology

## 2024-07-21 ENCOUNTER — Ambulatory Visit: Payer: Medicaid Other | Admitting: Neurology

## 2024-07-21 VITALS — BP 117/74 | HR 89 | Ht 67.0 in | Wt 182.0 lb

## 2024-07-21 DIAGNOSIS — R519 Headache, unspecified: Secondary | ICD-10-CM

## 2024-07-21 DIAGNOSIS — G43109 Migraine with aura, not intractable, without status migrainosus: Secondary | ICD-10-CM

## 2025-03-21 ENCOUNTER — Ambulatory Visit: Admitting: Neurology
